# Patient Record
Sex: Male | Born: 1955
Health system: Southern US, Community
[De-identification: ages and names within clinical notes are randomized; demographics above are authoritative.]

## PROBLEM LIST (undated history)

## (undated) DIAGNOSIS — E7800 Pure hypercholesterolemia, unspecified: Secondary | ICD-10-CM

## (undated) DIAGNOSIS — R112 Nausea with vomiting, unspecified: Secondary | ICD-10-CM

## (undated) DIAGNOSIS — E039 Hypothyroidism, unspecified: Secondary | ICD-10-CM

## (undated) DIAGNOSIS — J45909 Unspecified asthma, uncomplicated: Secondary | ICD-10-CM

## (undated) DIAGNOSIS — M1990 Unspecified osteoarthritis, unspecified site: Secondary | ICD-10-CM

## (undated) DIAGNOSIS — G4730 Sleep apnea, unspecified: Secondary | ICD-10-CM

## (undated) DIAGNOSIS — J189 Pneumonia, unspecified organism: Secondary | ICD-10-CM

## (undated) DIAGNOSIS — Z9889 Other specified postprocedural states: Secondary | ICD-10-CM

## (undated) DIAGNOSIS — I10 Essential (primary) hypertension: Secondary | ICD-10-CM

## (undated) HISTORY — PX: OTHER SURGICAL HISTORY: SHX169

## (undated) HISTORY — PX: ORTHOPEDIC SURGERY: SHX850

---

## 2000-12-17 ENCOUNTER — Encounter: Payer: Self-pay | Admitting: Internal Medicine

## 2000-12-17 ENCOUNTER — Ambulatory Visit (HOSPITAL_COMMUNITY): Admission: RE | Admit: 2000-12-17 | Discharge: 2000-12-17 | Payer: Self-pay | Admitting: Internal Medicine

## 2000-12-20 ENCOUNTER — Emergency Department (HOSPITAL_COMMUNITY): Admission: EM | Admit: 2000-12-20 | Discharge: 2000-12-20 | Payer: Self-pay | Admitting: *Deleted

## 2001-01-03 ENCOUNTER — Encounter: Payer: Self-pay | Admitting: Otolaryngology

## 2001-01-03 ENCOUNTER — Encounter: Admission: RE | Admit: 2001-01-03 | Discharge: 2001-01-03 | Payer: Self-pay | Admitting: Otolaryngology

## 2001-02-08 ENCOUNTER — Ambulatory Visit (HOSPITAL_COMMUNITY): Admission: RE | Admit: 2001-02-08 | Discharge: 2001-02-08 | Payer: Self-pay | Admitting: Gastroenterology

## 2002-07-03 ENCOUNTER — Encounter: Payer: Self-pay | Admitting: Internal Medicine

## 2002-07-03 ENCOUNTER — Ambulatory Visit (HOSPITAL_COMMUNITY): Admission: RE | Admit: 2002-07-03 | Discharge: 2002-07-03 | Payer: Self-pay | Admitting: Internal Medicine

## 2002-07-06 ENCOUNTER — Encounter: Payer: Self-pay | Admitting: Internal Medicine

## 2002-07-06 ENCOUNTER — Ambulatory Visit (HOSPITAL_COMMUNITY): Admission: RE | Admit: 2002-07-06 | Discharge: 2002-07-06 | Payer: Self-pay | Admitting: Internal Medicine

## 2002-07-10 ENCOUNTER — Encounter: Admission: RE | Admit: 2002-07-10 | Discharge: 2002-07-10 | Payer: Self-pay | Admitting: Orthopedic Surgery

## 2002-07-10 ENCOUNTER — Encounter: Payer: Self-pay | Admitting: Orthopedic Surgery

## 2002-07-11 ENCOUNTER — Encounter: Admission: RE | Admit: 2002-07-11 | Discharge: 2002-07-11 | Payer: Self-pay | Admitting: Orthopedic Surgery

## 2002-07-11 ENCOUNTER — Encounter: Payer: Self-pay | Admitting: Orthopedic Surgery

## 2002-12-05 ENCOUNTER — Encounter: Payer: Self-pay | Admitting: Family Medicine

## 2002-12-05 ENCOUNTER — Ambulatory Visit (HOSPITAL_COMMUNITY): Admission: RE | Admit: 2002-12-05 | Discharge: 2002-12-05 | Payer: Self-pay | Admitting: Family Medicine

## 2003-01-16 ENCOUNTER — Encounter: Payer: Self-pay | Admitting: Internal Medicine

## 2003-01-16 ENCOUNTER — Inpatient Hospital Stay (HOSPITAL_COMMUNITY): Admission: AD | Admit: 2003-01-16 | Discharge: 2003-01-18 | Payer: Self-pay | Admitting: Internal Medicine

## 2003-01-17 ENCOUNTER — Encounter: Payer: Self-pay | Admitting: Internal Medicine

## 2003-02-03 ENCOUNTER — Ambulatory Visit (HOSPITAL_BASED_OUTPATIENT_CLINIC_OR_DEPARTMENT_OTHER): Admission: RE | Admit: 2003-02-03 | Discharge: 2003-02-03 | Payer: Self-pay | Admitting: Neurology

## 2003-07-04 ENCOUNTER — Ambulatory Visit (HOSPITAL_COMMUNITY)
Admission: RE | Admit: 2003-07-04 | Discharge: 2003-07-04 | Payer: Self-pay | Admitting: Physical Medicine and Rehabilitation

## 2005-02-06 ENCOUNTER — Emergency Department (HOSPITAL_COMMUNITY): Admission: EM | Admit: 2005-02-06 | Discharge: 2005-02-06 | Payer: Self-pay | Admitting: Emergency Medicine

## 2009-01-21 ENCOUNTER — Ambulatory Visit (HOSPITAL_COMMUNITY): Admission: RE | Admit: 2009-01-21 | Discharge: 2009-01-21 | Payer: Self-pay | Admitting: Family Medicine

## 2010-09-25 NOTE — Discharge Summary (Signed)
   [name], [name]                         ACCOUNT NO.:  [id number]   MEDICAL RECORD NO.:  [id number]                   PATIENT TYPE:  INP   LOCATION:  A208                                 FACILITY:  [facility]   PHYSICIAN:  Madelin Rear. Sherwood Gambler, M.D.             DATE OF BIRTH:  05/17/1955   DATE OF ADMISSION:  01/16/2003  DATE OF DISCHARGE:  01/18/2003                                 DISCHARGE SUMMARY   DISCHARGE DIAGNOSES:  1. Syncope  2. Malaise and fatigue.  3. Hypertension.  4. Tension headache.  5. Hyperlipidemia.  6. Nonspecific chest pain.  7. Abdominal pain.   DISCHARGE MEDICATIONS:  1. Toprol XL 50 mg p.o. daily.  2. Xanax p.r.n.  3. Ambien p.r.n.  4. Zocor 20 mg daily.   SUMMARY:  The patient was admitted after he experienced recurrent episodes  over the past several months of recurrent weak spells and near syncope.  It  is not clearly positional or vertigo-like in nature.  He has some vague  chest discomfort located in the left upper chest as well as a squeezing and  sometimes sharp pain variable in duration, intensity and frequency.  He had  no palpitations, hematemesis, hematochezia, melena or GI symptoms except for  some limited left upper quadrant abdominal pain.  He has had intractable  headaches, worked by an Proofreader, and found to have no sinusitis.  Although, he did have several rounds of antibiotic therapy.  The patient was  admitted for atypical chest pain and syncope and rule out.   He responded well to intervention, was seen in consultation by Neurology as  well as Cardiology and discharged in stable condition for office follow up.  He had no recurrence of syncope.  Although, he was begun on Toprol in house  and tolerated this well.     ___________________________________________                                         Madelin Rear. Sherwood Gambler, M.D.   LJF/MEDQ  D:  02/04/2003  T:  02/04/2003  Job:  [id number]

## 2010-09-25 NOTE — H&P (Signed)
[name], [name] NO.:  [id number]   MEDICAL RECORD NO.:  [id number]                   PATIENT TYPE:   LOCATION:                                       FACILITY:   PHYSICIAN:  Madelin Rear. Sherwood Gambler, M.D.             DATE OF BIRTH:  10-Mar-1956   DATE OF ADMISSION:  DATE OF DISCHARGE:                                HISTORY & PHYSICAL   CHIEF COMPLAINT:  Weak spells.   HISTORY OF PRESENT ILLNESS:  The patient has had recurrent episodes over the  past two to three months of what he describes as a drained-out feeling.  It  is not clearly positional related or activity related.  He denies any  association with timing of meals or skipping same.  He did admit to some  vague chest discomfort located in the left upper chest area described as a  squeezing and sometimes sharp pain, variable in duration and intensity.  He  denied any palpitations.  He did admit to several episodes including today  of a diaphoretic spell.  He has had no nausea or vomiting; no hematemesis,  hematochezia, or melena.  He has had no abdominal pain, but please see the  exam below.  He denied any shortness of breath.  He did admit on review of  systems to having persistent intractable headaches worked up by an Proofreader  and found to have no sinusitis although he did complete several thorough  rounds of antibiotic therapy.  Today, he admits to from lying to sitting  having a little bit of positional vertigo.  This rapidly extinguishes  spontaneously.  His headache has been persistent and slightly worse today as  well.  Denies any stiff neck, visual changes, or other cranial nerve or  focal neurologic symptoms or signs.   PAST MEDICAL HISTORY:  1. Hyperlipidemia.  He is currently maintained on Zocor 20 mg p.o. daily.  2. He has allergic rhinitis maintained on antihistamine therapy.   SOCIAL HISTORY:  He does not smoke, rarely uses alcohol, and there is no  substance abuse.   FAMILY  HISTORY:  Positive for coronary disease in a deceased father as well  as deceased mother.  An aortic aneurysm in a paternal grandfather.  He has a  spouse that is in good health and his brothers and sisters are in good  health, one daughter in good health.   REVIEW OF SYSTEMS:  As under HPI; all else negative except he did admit to  some hot flash sensation.   PHYSICAL EXAMINATION:  GENERAL:  He appears ill but not toxic.  HEAD AND NECK:  Shows no JVD or adenopathy; neck is supple.  CHEST:  Clear.  CARDIAC:  Regular rhythm without murmur, gallop, or rub.  There are no  carotid bruits.  ABDOMEN:  Soft.  However, he did guard quite a bit when palpating the left  lower quadrant.  No organomegaly or masses were appreciated but the  tenderness in the left lower quadrant as well as suprapubic area was  reproducible.  He was unaware of the pain until I examined him.  EXTREMITIES:  Showed no clubbing, cyanosis, or edema.  NEUROLOGIC:  Shows no evidence of nystagmus even during an episode of  vertigo which extinguished.  No cranial nerve deficits, and all motor  function was normal.  Gait was not assessed due to his ill feeling.   LABORATORY DATA:  EKG was obtained in the office which revealed no acute  ischemic or infarction and sinus rhythm was noted.   IMPRESSION:  1. Atypical chest pain associated with near syncope.  He needs a rule out     myocardial infarction protocol and be admitted for telemetry monitoring.  2. Hypertension.  Noted in the office to be 152/100 with some leaps of blood     pressure according to him historically on self-observation.  Rule out     pheochromocytoma, especially when associated with near syncope and hot     flash sensation.  P.r.n. coverage with clonidine pending a renal vascular     workup and cardiac workup.  3. Vertigo with persistent headaches.  Check an MRI, make sure there is no     posterior fossa lesion.  Neurology consultation.  Cardiology  consultation     for above.                                               Madelin Rear. Sherwood Gambler, M.D.    LJF/MEDQ  D:  01/16/2003  T:  01/16/2003  Job:  [id number]

## 2010-09-25 NOTE — Consult Note (Signed)
NAMESTEPHENS, [name]                         ACCOUNT NO.:  [id number]   MEDICAL RECORD NO.:  [id number]                   PATIENT TYPE:  INP   LOCATION:  A208                                 FACILITY:  [facility]   PHYSICIAN:  Kofi A. Gerilyn Pilgrim, M.D.              DATE OF BIRTH:  06/25/1955   DATE OF CONSULTATION:  01/16/2003  DATE OF DISCHARGE:                                   CONSULTATION   REPORT TITLE:  NEUROLOGY CONSULTATION   CONSULTING PHYSICIAN:  Kofi A. Gerilyn Pilgrim, M.D.   REFERRING PHYSICIAN:  Madelin Rear. Sherwood Gambler, M.D.   IMPRESSION:  1. Mixed tension/migraine headaches, now that have been transformed to     chronic daily headaches.  2. Unusual spell; I suspect that it is due to episodic increase in blood     pressure.  Other possibilities include thyroid disease, arrhythmias,     headache and even anxiety disorder.  3. Suspect that he has obstructive sleep apnea syndrome.   RECOMMENDATIONS:  1. Agree with current evaluation and workup.  2. I am going to add a polysomnography to the evaluation.  3. I am also going to suggest beta-blockers which could help with headache     prevention and blood pressure control.   HISTORY:  This is a 55 year old Caucasian man who reportedly has not been  feeling well over the past few months.  He reports, for the past 3-4, not  feeling well.  Upon awakening, he reports feeling unrested and weak in  general.  His wife does endorse that he snores nightly.  The patient has had  chronic headaches for many months.  The headaches are located in the  bifrontal region and also in the paranasal region, but they are particularly  located in the frontal region.  They occur almost on a daily basis.  Over  the past 30 days, he has had 20 headache days.  The headaches are not  associated with nausea, emesis or phonophobia; however, they are associated  with photophobia and blurry vision.   The patient was actually admitted to the hospital because of  having some  unusual spells.  The spells are characterized by the acute onset of  generalized weakness as if he is unable to do anything.  He then feels a  warm sensation all over his body, particularly the thoracic and abdominal  region.  These spells last approximately four hours.  These spells always  happen in the morning, approximately 10 a.m. to 11 a.m.  They happen both  after food and also if he does not eat breakfast.   The patient goes to bed at approximately 1:30 to 2 a.m. because he works the  second shift.  He then sleeps until about 9 a.m.  The patient  reports  having headaches with these spells, but the headaches seem not to be the  problem, I think.  His baseline headaches seem to  increase when he has these  spells.  The patient denies any focal weakness, numbness, dysarthria,  dysphagia, or diplopia with these spells.  There is no specific chest pain  or palpitations noted.  No nausea or GI symptoms are reported with these  spells.    PAST MEDICAL HISTORY:  1. Seasonal allergies.  2. Diskogenic low back pain.  3. Hypercholesterolemia.  4. The patient was recently diagnosed with hypertension and has been placed     on blood pressure medication over the past two weeks.  5. History of injury to the right knee.   ADMISSION MEDICATIONS:  Hydrochlorothiazide, Zocor, Nasacort, nasal saline,  Bextra, and Allegra.   PAST SURGICAL HISTORY:  The patient has had right ankle surgery four times.  He has had sinus surgery and also two epidural injections for low back pain.   REVIEW OF SYSTEMS:  Our review of systems were reviewed extensively and  unremarkable, other than as stated in the history of present illness.   FAMILY HISTORY:  Significant for cardiac disease in both parents, and also  mother who had a stroke.   SOCIAL HISTORY:  The patient is married.  No reports of alcohol or tobacco  use.   PHYSICAL EXAMINATION:  GENERAL:  Reveals a well-developed pleasant  gentleman  in no acute distress.  VITAL SIGNS:  Blood pressure 140/101, pulse 74, respirations 20, temperature  98.  NECK:  Supple.  LUNGS:  Clear to auscultation bilaterally.  CARDIOVASCULAR:  Shows a normal S1 and S2.  ABDOMEN:  Soft.  EXTREMITIES: No edema.  NEUROLOGICAL:  The patient is awake, alert and converses coherently and with  no obvious language or speech problems.  Cranial nerve evaluation shows that  the pupils are equal, round and react to light and accommodation.  Extraocular movements are full.  Visual fields are intact.  Funduscopic  examination is done on the left and fundus is healthy with flat disks and  spontaneous venous pulsations.  Facial muscle strength is normal.  Tongue is  midline.  Uvula is midline.  Motor examination shows normal tone, bulk and  strength.  There is no pronator drift.  Coordination is intact.  Reflexes  are +2.  Plantar reflexes are both flexor.  Sensory examination is normal to  temperature and light touch.  Gait is normal.    LABORATORY DATA:  Laboratory values were reviewed and are essentially  unremarkable, although he had potassium 4.1.  CPK 121.  CBC is fine.  Liver  enzymes are normal.  Urinalysis is normal.   Thank you for this consultation.                                               Kofi A. Gerilyn Pilgrim, M.D.    KAD/MEDQ  D:  01/16/2003  T:  01/16/2003  Job:  [id number]   cc:   Madelin Rear. Sherwood Gambler, M.D.  [address]  [place]  [place] [address]  Fax: [phone]

## 2011-02-07 ENCOUNTER — Inpatient Hospital Stay (INDEPENDENT_AMBULATORY_CARE_PROVIDER_SITE_OTHER)
Admission: RE | Admit: 2011-02-07 | Discharge: 2011-02-07 | Disposition: A | Discharge status: Home / Self Care | Payer: 59 | Source: Ambulatory Visit | Attending: Emergency Medicine | Admitting: Emergency Medicine

## 2011-02-07 DIAGNOSIS — H60399 Other infective otitis externa, unspecified ear: Secondary | ICD-10-CM

## 2011-07-04 ENCOUNTER — Emergency Department (HOSPITAL_COMMUNITY)
Admission: EM | Admit: 2011-07-04 | Discharge: 2011-07-04 | Disposition: A | Discharge status: Home / Self Care | Payer: 59 | Source: Home / Self Care | Attending: Emergency Medicine | Admitting: Emergency Medicine

## 2011-07-04 ENCOUNTER — Encounter (HOSPITAL_COMMUNITY): Payer: Self-pay

## 2011-07-04 DIAGNOSIS — J069 Acute upper respiratory infection, unspecified: Secondary | ICD-10-CM

## 2011-07-04 DIAGNOSIS — J329 Chronic sinusitis, unspecified: Secondary | ICD-10-CM

## 2011-07-04 HISTORY — DX: Hypothyroidism, unspecified: E03.9

## 2011-07-04 HISTORY — DX: Pure hypercholesterolemia, unspecified: E78.00

## 2011-07-04 MED ORDER — AMOXICILLIN-POT CLAVULANATE 875-125 MG PO TABS: 1.0000 | ORAL_TABLET | Freq: Two times a day (BID) | ORAL | Status: AC

## 2011-07-04 MED ORDER — BENZONATATE 200 MG PO CAPS: 200.0000 mg | ORAL_CAPSULE | Freq: Three times a day (TID) | ORAL | Status: AC | PRN

## 2011-07-04 NOTE — ED Provider Notes (Signed)
Chief Complaint  Patient presents with  . Cough    History of Present Illness:   [name] has had a one-week history of nasal congestion with yellow-green drainage, sinus pressure, and ear congestion. He also notes sore throat, postnasal drip, hoarseness, and cough productive yellow-green sputum and he has not had fever or chills.  Review of Systems:  Other than noted above, the patient denies any of the following symptoms. Systemic:  No fever, chills, sweats, fatigue, myalgias, headache, or anorexia. Eye:  No redness, pain or drainage. ENT:  No earache, nasal congestion, rhinorrhea, sinus pressure, or sore throat. Lungs:  No cough, sputum production, wheezing, shortness of breath. Or chest pain. GI:  No nausea, vomiting, abdominal pain or diarrhea. Skin:  No rash or itching.  PMFSH:  Past medical history, family history, social history, meds, and allergies were reviewed.  Physical Exam:   Vital signs:  BP 145/81  Pulse 91  Temp(Src) 99.9 F (37.7 C) (Oral)  Resp 20  SpO2 98% General:  Alert, in no distress. Eye:  No conjunctival injection or drainage. ENT:  TMs and canals were normal, without erythema or inflammation.  Nasal mucosa was clear and uncongested, without drainage.  Mucous membranes were moist.  Pharynx was clear, without exudate or drainage.  There were no oral ulcerations or lesions. Neck:  Supple, no adenopathy, tenderness or mass. Lungs:  No respiratory distress.  Lungs were clear to auscultation, without wheezes, rales or rhonchi.  Breath sounds were clear and equal bilaterally. Heart:  Regular rhythm, without gallops, murmers or rubs. Skin:  Clear, warm, and dry, without rash or lesions.  Labs:  No results found for this or any previous visit.   Radiology:  No results found.  Assessment:   Diagnoses that have been ruled out:  None  Diagnoses that are still under consideration:  None  Final diagnoses:  Viral upper respiratory infection  Sinusitis       Plan:   1.  The following meds were prescribed:   New Prescriptions   AMOXICILLIN-CLAVULANATE (AUGMENTIN) 875-125 MG PER TABLET    Take 1 tablet by mouth 2 (two) times daily.   BENZONATATE (TESSALON) 200 MG CAPSULE    Take 1 capsule (200 mg total) by mouth 3 (three) times daily as needed for cough.   2.  The patient was instructed in symptomatic care and handouts were given. 3.  The patient was told to return if becoming worse in any way, if no better in 3 or 4 days, and given some red flag symptoms that would indicate earlier return.   Roque Lias, MD 07/04/11 1055

## 2011-07-04 NOTE — ED Notes (Signed)
Pt has sorethroat, cough and head and chest congestion for one week.

## 2011-07-04 NOTE — Discharge Instructions (Signed)

## 2012-03-28 ENCOUNTER — Other Ambulatory Visit (HOSPITAL_COMMUNITY): Payer: Self-pay | Admitting: Internal Medicine

## 2012-03-28 DIAGNOSIS — R1011 Right upper quadrant pain: Secondary | ICD-10-CM

## 2012-03-30 ENCOUNTER — Ambulatory Visit (HOSPITAL_COMMUNITY)
Admission: RE | Admit: 2012-03-30 | Discharge: 2012-03-30 | Disposition: A | Discharge status: Home / Self Care | Payer: 59 | Source: Ambulatory Visit | Attending: Internal Medicine | Admitting: Internal Medicine

## 2012-03-30 DIAGNOSIS — R1011 Right upper quadrant pain: Secondary | ICD-10-CM

## 2013-01-18 ENCOUNTER — Other Ambulatory Visit: Payer: Self-pay | Admitting: Cardiovascular Disease

## 2013-01-18 LAB — HEMOGLOBIN A1C
Hgb A1c MFr Bld: 6.1 % — ABNORMAL HIGH (ref <5.7)
Mean Plasma Glucose: 128 mg/dL — ABNORMAL HIGH (ref <117)

## 2013-01-18 LAB — CBC WITH DIFFERENTIAL/PLATELET
Eosinophils Relative: 5 % (ref 0–5)
HCT: 41.7 % (ref 39.0–52.0)
Hemoglobin: 14.6 g/dL (ref 13.0–17.0)
Lymphocytes Relative: 37 % (ref 12–46)
Lymphs Abs: 2.3 10*3/uL (ref 0.7–4.0)
MCV: 89.7 fL (ref 78.0–100.0)
Monocytes Absolute: 0.4 10*3/uL (ref 0.1–1.0)
Monocytes Relative: 7 % (ref 3–12)
Platelets: 250 10*3/uL (ref 150–400)
RBC: 4.65 MIL/uL (ref 4.22–5.81)
WBC: 6.3 10*3/uL (ref 4.0–10.5)

## 2013-01-18 LAB — LIPID PANEL
Cholesterol: 193 mg/dL (ref 0–200)
Total CHOL/HDL Ratio: 3.2 Ratio

## 2013-01-18 LAB — COMPREHENSIVE METABOLIC PANEL
ALT: 24 U/L (ref 0–53)
CO2: 27 mEq/L (ref 19–32)
Calcium: 9.2 mg/dL (ref 8.4–10.5)
Chloride: 104 mEq/L (ref 96–112)
Creat: 1.14 mg/dL (ref 0.50–1.35)
Glucose, Bld: 94 mg/dL (ref 70–99)

## 2013-02-20 ENCOUNTER — Emergency Department (HOSPITAL_COMMUNITY): Payer: 59

## 2013-02-20 ENCOUNTER — Encounter (HOSPITAL_COMMUNITY): Payer: Self-pay | Admitting: Emergency Medicine

## 2013-02-20 ENCOUNTER — Emergency Department (HOSPITAL_COMMUNITY)
Admission: EM | Admit: 2013-02-20 | Discharge: 2013-02-20 | Disposition: A | Discharge status: Home / Self Care | Payer: 59 | Attending: Emergency Medicine | Admitting: Emergency Medicine

## 2013-02-20 DIAGNOSIS — S139XXA Sprain of joints and ligaments of unspecified parts of neck, initial encounter: Secondary | ICD-10-CM | POA: Insufficient documentation

## 2013-02-20 DIAGNOSIS — S339XXA Sprain of unspecified parts of lumbar spine and pelvis, initial encounter: Secondary | ICD-10-CM | POA: Insufficient documentation

## 2013-02-20 DIAGNOSIS — Z79899 Other long term (current) drug therapy: Secondary | ICD-10-CM | POA: Insufficient documentation

## 2013-02-20 DIAGNOSIS — Y92410 Unspecified street and highway as the place of occurrence of the external cause: Secondary | ICD-10-CM | POA: Insufficient documentation

## 2013-02-20 DIAGNOSIS — S39012A Strain of muscle, fascia and tendon of lower back, initial encounter: Secondary | ICD-10-CM

## 2013-02-20 DIAGNOSIS — S161XXA Strain of muscle, fascia and tendon at neck level, initial encounter: Secondary | ICD-10-CM

## 2013-02-20 DIAGNOSIS — Y9389 Activity, other specified: Secondary | ICD-10-CM | POA: Insufficient documentation

## 2013-02-20 DIAGNOSIS — E7800 Pure hypercholesterolemia, unspecified: Secondary | ICD-10-CM | POA: Insufficient documentation

## 2013-02-20 DIAGNOSIS — E039 Hypothyroidism, unspecified: Secondary | ICD-10-CM | POA: Insufficient documentation

## 2013-02-20 MED ORDER — HYDROCODONE-ACETAMINOPHEN 5-325 MG PO TABS
2.0000 | ORAL_TABLET | Freq: Once | ORAL | Status: AC
  Administered 2013-02-20: 2 via ORAL
  Filled 2013-02-20: qty 2
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
## 2014-07-12 ENCOUNTER — Other Ambulatory Visit (HOSPITAL_COMMUNITY): Payer: Self-pay | Admitting: Internal Medicine

## 2014-07-12 DIAGNOSIS — R1011 Right upper quadrant pain: Secondary | ICD-10-CM

## 2014-07-17 ENCOUNTER — Ambulatory Visit (HOSPITAL_COMMUNITY)
Admission: RE | Admit: 2014-07-17 | Discharge: 2014-07-17 | Disposition: A | Discharge status: Home / Self Care | Payer: 59 | Source: Ambulatory Visit | Attending: Internal Medicine | Admitting: Internal Medicine

## 2014-07-17 DIAGNOSIS — R1011 Right upper quadrant pain: Secondary | ICD-10-CM | POA: Insufficient documentation

## 2014-08-21 ENCOUNTER — Encounter (INDEPENDENT_AMBULATORY_CARE_PROVIDER_SITE_OTHER): Payer: Self-pay | Admitting: *Deleted

## 2014-09-10 ENCOUNTER — Ambulatory Visit (INDEPENDENT_AMBULATORY_CARE_PROVIDER_SITE_OTHER): Payer: 59 | Admitting: Internal Medicine

## 2015-01-14 ENCOUNTER — Encounter: Payer: Self-pay | Admitting: Cardiology

## 2015-01-14 ENCOUNTER — Encounter: Payer: Self-pay | Admitting: Cardiovascular Disease

## 2015-07-02 ENCOUNTER — Other Ambulatory Visit (HOSPITAL_COMMUNITY): Payer: Self-pay | Admitting: Internal Medicine

## 2015-07-02 DIAGNOSIS — R198 Other specified symptoms and signs involving the digestive system and abdomen: Secondary | ICD-10-CM

## 2015-07-08 ENCOUNTER — Encounter (HOSPITAL_COMMUNITY): Payer: Self-pay

## 2015-07-08 ENCOUNTER — Encounter (HOSPITAL_COMMUNITY)
Admission: RE | Admit: 2015-07-08 | Discharge: 2015-07-08 | Disposition: A | Discharge status: Home / Self Care | Payer: Commercial Managed Care - HMO | Source: Ambulatory Visit | Attending: Internal Medicine | Admitting: Internal Medicine

## 2015-07-08 DIAGNOSIS — R198 Other specified symptoms and signs involving the digestive system and abdomen: Secondary | ICD-10-CM | POA: Insufficient documentation

## 2015-07-08 MED ORDER — TECHNETIUM TC 99M MEBROFENIN IV KIT
5.0000 | PACK | Freq: Once | INTRAVENOUS | Status: AC | PRN
  Administered 2015-07-08: 5 via INTRAVENOUS

## 2015-07-08 MED ORDER — STERILE WATER FOR INJECTION IJ SOLN
INTRAMUSCULAR | Status: AC
  Administered 2015-07-08: 1.6 mL
  Filled 2015-07-08: qty 10

## 2015-07-08 MED ORDER — SINCALIDE 5 MCG IJ SOLR
INTRAMUSCULAR | Status: AC
  Administered 2015-07-08: 1.6 ug
  Filled 2015-07-08: qty 5

## 2015-07-09 ENCOUNTER — Encounter: Payer: Self-pay | Admitting: *Deleted

## 2015-07-10 ENCOUNTER — Ambulatory Visit (INDEPENDENT_AMBULATORY_CARE_PROVIDER_SITE_OTHER): Payer: Commercial Managed Care - HMO | Admitting: Cardiovascular Disease

## 2015-07-10 ENCOUNTER — Encounter: Payer: Self-pay | Admitting: *Deleted

## 2015-07-10 ENCOUNTER — Encounter: Payer: Self-pay | Admitting: Cardiovascular Disease

## 2015-07-10 VITALS — BP 107/72 | HR 90 | Ht 64.0 in | Wt 186.0 lb

## 2015-07-10 DIAGNOSIS — Z136 Encounter for screening for cardiovascular disorders: Secondary | ICD-10-CM | POA: Diagnosis not present

## 2015-07-10 DIAGNOSIS — Z8249 Family history of ischemic heart disease and other diseases of the circulatory system: Secondary | ICD-10-CM

## 2015-07-10 DIAGNOSIS — R0609 Other forms of dyspnea: Secondary | ICD-10-CM | POA: Diagnosis not present

## 2015-07-10 DIAGNOSIS — E785 Hyperlipidemia, unspecified: Secondary | ICD-10-CM

## 2015-07-10 DIAGNOSIS — I10 Essential (primary) hypertension: Secondary | ICD-10-CM | POA: Diagnosis not present

## 2015-07-10 NOTE — Progress Notes (Signed)
Patient ID: [name], male   DOB: 07/18/1955, 60 y.o.   MRN: [id number]       CARDIOLOGY CONSULT NOTE  Patient ID: [name] MRN: [id number] DOB/AGE: [birth year] 60 y.o.  Admit date: (Not on file) Primary Physician Dwana Melena, MD  Reason for Consultation: HTN, chest pain  HPI: The patient is a 60 year old male with a history of hypertension, hyperlipidemia, and hypothyroidism.  He underwent a normal nuclear stress test in 2012.  ECG performed in the office today demonstrates normal sinus rhythm with no ischemic ST segment or T-wave abnormalities, nor any arrhythmias.  He has a family history of heart disease and wanted to be evaluated. His father died at the age of 64 of coronary artery disease and his mother died at the age of 64 of coronary artery disease. His father had his first MI at 61. His mother had valve replacement at 15 and also had a stroke. He said they were chain smokers. His oldest sister had an MI at age 61 and eventually succumbed to coronary disease. She was a heavy smoker as well.  The patient denies any symptoms of chest pain, palpitations, shortness of breath, lightheadedness, dizziness, leg swelling, orthopnea, PND, and syncope.  He does not smoke and avoids red meat. He works 12 hours a day and denies any premature fatigue over the past one year.  A review of labs performed on 06/26/15 showed BUN 14, creatinine 1.19, hemoglobin 13.5, platelets 246, total cholesterol 200, triglycerides 82, HDL 46, LDL 138. HbA1c 6%, TSH 0.5.  Fam Hx: He has a family history of heart disease and wanted to be evaluated. His father died at the age of 59 of coronary artery disease and his mother died at the age of 74 of coronary artery disease. His father had his first MI at 93. His mother had valve replacement at 19 and also had a stroke. He said they were chain smokers. His oldest sister had an MI at age 76 and eventually succumbed to coronary disease. She was a heavy smoker  as well.    Allergies  Allergen Reactions  . Ceclor [Cefaclor] Itching    Current Outpatient Prescriptions  Medication Sig Dispense Refill  . Coenzyme Q10 (CO Q 10 PO) Take 1 capsule by mouth daily.     . Cyanocobalamin (B-12 PO) Take 1 tablet by mouth daily.    . fexofenadine (ALLEGRA) 180 MG tablet Take 180 mg by mouth daily.    Marland Kitchen levothyroxine (SYNTHROID, LEVOTHROID) 88 MCG tablet Take 1 tablet by mouth daily.    Marland Kitchen lisinopril (PRINIVIL,ZESTRIL) 5 MG tablet Take 1 tablet by mouth daily.    . Magnesium Hydroxide (MAGNESIA PO) Take 1 tablet by mouth daily.    . Multiple Vitamin (MULTIVITAMIN WITH MINERALS) TABS tablet Take 1 tablet by mouth daily.    . Omega-3 Fatty Acids (FISH OIL) 1000 MG CAPS Take 1 capsule by mouth daily.    . pravastatin (PRAVACHOL) 80 MG tablet Take 80 mg by mouth at bedtime.     Marland Kitchen PROAIR HFA 108 (90 Base) MCG/ACT inhaler Inhale 2 puffs into the lungs every 4 (four) hours as needed.    . vitamin C (ASCORBIC ACID) 500 MG tablet Take 500 mg by mouth daily.    Marland Kitchen zinc gluconate 50 MG tablet Take 50 mg by mouth daily.     No current facility-administered medications for this visit.    Past Medical History  Diagnosis Date  . Hypothyroidism   .  Hypercholesteremia     Past Surgical History  Procedure Laterality Date  . Orthopedic surgery      ankle  . Other surgical history      sinus    Social History   Social History  . Marital Status: Married    Spouse Name: N/A  . Number of Children: N/A  . Years of Education: N/A   Occupational History  . Not on file.   Social History Main Topics  . Smoking status: Never Smoker   . Smokeless tobacco: Never Used  . Alcohol Use: 0.0 oz/week    0 Standard drinks or equivalent per week  . Drug Use: No  . Sexual Activity: Not on file   Other Topics Concern  . Not on file   Social History Narrative       Prior to Admission medications   Medication Sig Start Date End Date Taking? Authorizing Provider    Coenzyme Q10 (CO Q 10 PO) Take 1 capsule by mouth daily.    Yes Historical Provider, MD  Cyanocobalamin (B-12 PO) Take 1 tablet by mouth daily.   Yes Historical Provider, MD  fexofenadine (ALLEGRA) 180 MG tablet Take 180 mg by mouth daily.   Yes Historical Provider, MD  levothyroxine (SYNTHROID, LEVOTHROID) 88 MCG tablet Take 1 tablet by mouth daily. 06/28/15  Yes Historical Provider, MD  lisinopril (PRINIVIL,ZESTRIL) 5 MG tablet Take 1 tablet by mouth daily. 06/28/15  Yes Historical Provider, MD  Magnesium Hydroxide (MAGNESIA PO) Take 1 tablet by mouth daily.   Yes Historical Provider, MD  Multiple Vitamin (MULTIVITAMIN WITH MINERALS) TABS tablet Take 1 tablet by mouth daily.   Yes Historical Provider, MD  Omega-3 Fatty Acids (FISH OIL) 1000 MG CAPS Take 1 capsule by mouth daily.   Yes Historical Provider, MD  pravastatin (PRAVACHOL) 80 MG tablet Take 80 mg by mouth at bedtime.  01/25/13  Yes Historical Provider, MD  PROAIR HFA 108 (90 Base) MCG/ACT inhaler Inhale 2 puffs into the lungs every 4 (four) hours as needed. 07/01/15  Yes Historical Provider, MD  vitamin C (ASCORBIC ACID) 500 MG tablet Take 500 mg by mouth daily.   Yes Historical Provider, MD  zinc gluconate 50 MG tablet Take 50 mg by mouth daily.   Yes Historical Provider, MD     Review of systems complete and found to be negative unless listed above in HPI     Physical exam Blood pressure 107/72, pulse 90, height  (1.626 m), weight 186 lb (84.369 kg). General: NAD Neck: No JVD, no thyromegaly or thyroid nodule.  Lungs: Clear to auscultation bilaterally with normal respiratory effort. CV: Nondisplaced PMI. Regular rate and rhythm, normal S1/S2, no S3/S4, no murmur.  No peripheral edema.  No carotid bruit.    Abdomen: Soft, nontender, obese, no distention.  Skin: Intact without lesions or rashes.  Neurologic: Alert and oriented x 3.  Psych: Normal affect. Extremities: No clubbing or cyanosis.  HEENT: Normal.   ECG: Most  recent ECG reviewed.  Labs:   Lab Results  Component Value Date   WBC 6.3 01/18/2013   HGB 14.6 01/18/2013   HCT 41.7 01/18/2013   MCV 89.7 01/18/2013   PLT 250 01/18/2013   No results for input(s): NA, K, CL, CO2, BUN, CREATININE, CALCIUM, PROT, BILITOT, ALKPHOS, ALT, AST, GLUCOSE in the last 168 hours.  Invalid input(s): LABALBU No results found for: CKTOTAL, CKMB, CKMBINDEX, TROPONINI  Lab Results  Component Value Date   CHOL 193 01/18/2013  Lab Results  Component Value Date   HDL 61 01/18/2013   Lab Results  Component Value Date   LDLCALC 114* 01/18/2013   Lab Results  Component Value Date   TRIG 90 01/18/2013   Lab Results  Component Value Date   CHOLHDL 3.2 01/18/2013   No results found for: LDLDIRECT       Studies: Nm Hepato W/eject Fract  07/08/2015  CLINICAL DATA:  Pain. EXAM: NUCLEAR MEDICINE HEPATOBILIARY IMAGING WITH GALLBLADDER EF TECHNIQUE: Sequential images of the abdomen were obtained out to 60 minutes following intravenous administration of radiopharmaceutical. After slow intravenous infusion of 1.64 micrograms Cholecystokinin, gallbladder ejection fraction was determined. RADIOPHARMACEUTICALS:  5.0 mCi Tc-78m Choletec IV COMPARISON:  Ultrasound 07/17/2014. FINDINGS: Liver, biliary system, gallbladder, bowel normal. Gallbladder ejection fraction at 60 minutes is a hilar percent. At 60 min, normal ejection fraction is greater than 40%. IMPRESSION: Normal exam. Electronically Signed   By: Maisie Fus  Register   On: 07/08/2015 12:55    ASSESSMENT AND PLAN:  1. Minimal exertional dyspnea: Likely related to seasonal allergies. Given normal ECG and well controlled BP and reasonably controlled lipids, I do not feel noninvasive cardiac testing is warranted at this time. However, I told him should he begin to experience any concerning symptoms, to call me and I will reevaluate him.  2. Essential HTN: Controlled. No changes.  3. Hyperlipidemia: Reviewed above.  Continue statin therapy.  Dispo: f/u prn.   Signed: Prentice Docker, M.D., F.A.C.C.  07/10/2015, 8:33 AM

## 2015-07-10 NOTE — Patient Instructions (Signed)
Continue all current medications. Follow up as needed  

## 2016-06-25 DIAGNOSIS — E039 Hypothyroidism, unspecified: Secondary | ICD-10-CM | POA: Diagnosis not present

## 2016-06-25 DIAGNOSIS — E782 Mixed hyperlipidemia: Secondary | ICD-10-CM | POA: Diagnosis not present

## 2016-06-25 DIAGNOSIS — I10 Essential (primary) hypertension: Secondary | ICD-10-CM | POA: Diagnosis not present

## 2016-06-25 DIAGNOSIS — R7301 Impaired fasting glucose: Secondary | ICD-10-CM | POA: Diagnosis not present

## 2016-06-29 DIAGNOSIS — Z0000 Encounter for general adult medical examination without abnormal findings: Secondary | ICD-10-CM | POA: Diagnosis not present

## 2016-06-29 DIAGNOSIS — I10 Essential (primary) hypertension: Secondary | ICD-10-CM | POA: Diagnosis not present

## 2016-06-29 DIAGNOSIS — E782 Mixed hyperlipidemia: Secondary | ICD-10-CM | POA: Diagnosis not present

## 2016-07-17 DIAGNOSIS — I10 Essential (primary) hypertension: Secondary | ICD-10-CM | POA: Diagnosis not present

## 2016-12-24 DIAGNOSIS — I10 Essential (primary) hypertension: Secondary | ICD-10-CM | POA: Diagnosis not present

## 2016-12-24 DIAGNOSIS — E039 Hypothyroidism, unspecified: Secondary | ICD-10-CM | POA: Diagnosis not present

## 2016-12-24 DIAGNOSIS — R7301 Impaired fasting glucose: Secondary | ICD-10-CM | POA: Diagnosis not present

## 2016-12-28 DIAGNOSIS — E782 Mixed hyperlipidemia: Secondary | ICD-10-CM | POA: Diagnosis not present

## 2016-12-28 DIAGNOSIS — I10 Essential (primary) hypertension: Secondary | ICD-10-CM | POA: Diagnosis not present

## 2016-12-28 DIAGNOSIS — R7301 Impaired fasting glucose: Secondary | ICD-10-CM | POA: Diagnosis not present

## 2017-01-11 DIAGNOSIS — M25571 Pain in right ankle and joints of right foot: Secondary | ICD-10-CM | POA: Diagnosis not present

## 2017-02-25 DIAGNOSIS — M25571 Pain in right ankle and joints of right foot: Secondary | ICD-10-CM | POA: Diagnosis not present

## 2017-03-04 DIAGNOSIS — M25571 Pain in right ankle and joints of right foot: Secondary | ICD-10-CM | POA: Diagnosis not present

## 2017-03-11 DIAGNOSIS — M1711 Unilateral primary osteoarthritis, right knee: Secondary | ICD-10-CM | POA: Diagnosis not present

## 2017-03-28 IMAGING — NM NM HEPATO W/GB/PHARM/[PERSON_NAME]
2 series · 12 of 12 positions shown · non-contrast
Comparison: Ultrasound 07/17/2014.

CLINICAL DATA: Pain.

EXAM:
NUCLEAR MEDICINE HEPATOBILIARY IMAGING WITH GALLBLADDER EF
TECHNIQUE: Sequential images of the abdomen were obtained [DATE] minutes
following intravenous administration of radiopharmaceutical. After
slow intravenous infusion of 1.64 micrograms Cholecystokinin,
gallbladder ejection fraction was determined.
RADIOPHARMACEUTICALS:  5.0 mCi Qc-YYm Choletec IV

[Series 1: biliary · 3.25mm/px · 6 of 60 frames shown]
[frame 6/60]
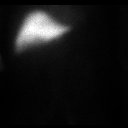
[frame 16/60]
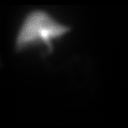
[frame 26/60]
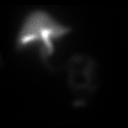
[frame 36/60]
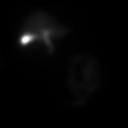
[frame 46/60]
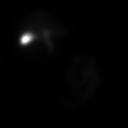
[frame 56/60]
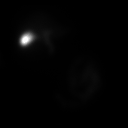

[Series 2: gbef · 3.25mm/px · 6 of 60 frames shown]
[frame 6/60]
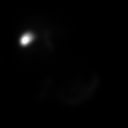
[frame 16/60]
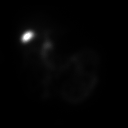
[frame 26/60]
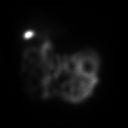
[frame 36/60]
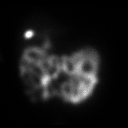
[frame 46/60]
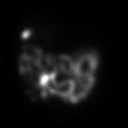
[frame 56/60]
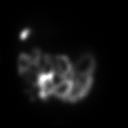

[12 of 12 positions shown; findings below may reference images not displayed]

FINDINGS: Liver, biliary system, gallbladder, bowel normal. Gallbladder
ejection fraction at 60 minutes is a hilar percent.. At 60 min,
normal ejection fraction is greater than 40%.
IMPRESSION: Normal exam.

## 2017-05-23 DIAGNOSIS — J019 Acute sinusitis, unspecified: Secondary | ICD-10-CM | POA: Diagnosis not present

## 2017-05-30 DIAGNOSIS — M1711 Unilateral primary osteoarthritis, right knee: Secondary | ICD-10-CM | POA: Diagnosis not present

## 2017-06-27 DIAGNOSIS — M1711 Unilateral primary osteoarthritis, right knee: Secondary | ICD-10-CM | POA: Diagnosis not present

## 2017-07-01 DIAGNOSIS — I10 Essential (primary) hypertension: Secondary | ICD-10-CM | POA: Diagnosis not present

## 2017-07-01 DIAGNOSIS — E782 Mixed hyperlipidemia: Secondary | ICD-10-CM | POA: Diagnosis not present

## 2017-07-01 DIAGNOSIS — E039 Hypothyroidism, unspecified: Secondary | ICD-10-CM | POA: Diagnosis not present

## 2017-07-06 DIAGNOSIS — Z0000 Encounter for general adult medical examination without abnormal findings: Secondary | ICD-10-CM | POA: Diagnosis not present

## 2017-07-17 DIAGNOSIS — M79644 Pain in right finger(s): Secondary | ICD-10-CM | POA: Diagnosis not present

## 2017-07-17 DIAGNOSIS — L03011 Cellulitis of right finger: Secondary | ICD-10-CM | POA: Diagnosis not present

## 2017-07-22 DIAGNOSIS — M25572 Pain in left ankle and joints of left foot: Secondary | ICD-10-CM | POA: Diagnosis not present

## 2017-07-29 DIAGNOSIS — M7662 Achilles tendinitis, left leg: Secondary | ICD-10-CM | POA: Diagnosis not present

## 2017-08-03 DIAGNOSIS — M7662 Achilles tendinitis, left leg: Secondary | ICD-10-CM | POA: Diagnosis not present

## 2017-08-03 DIAGNOSIS — M9901 Segmental and somatic dysfunction of cervical region: Secondary | ICD-10-CM | POA: Diagnosis not present

## 2017-08-03 DIAGNOSIS — M47812 Spondylosis without myelopathy or radiculopathy, cervical region: Secondary | ICD-10-CM | POA: Diagnosis not present

## 2017-08-03 DIAGNOSIS — M79672 Pain in left foot: Secondary | ICD-10-CM | POA: Diagnosis not present

## 2017-08-03 DIAGNOSIS — M5032 Other cervical disc degeneration, mid-cervical region, unspecified level: Secondary | ICD-10-CM | POA: Diagnosis not present

## 2017-08-08 DIAGNOSIS — M47812 Spondylosis without myelopathy or radiculopathy, cervical region: Secondary | ICD-10-CM | POA: Diagnosis not present

## 2017-08-08 DIAGNOSIS — M79672 Pain in left foot: Secondary | ICD-10-CM | POA: Diagnosis not present

## 2017-08-08 DIAGNOSIS — M9901 Segmental and somatic dysfunction of cervical region: Secondary | ICD-10-CM | POA: Diagnosis not present

## 2017-08-08 DIAGNOSIS — M7662 Achilles tendinitis, left leg: Secondary | ICD-10-CM | POA: Diagnosis not present

## 2017-08-08 DIAGNOSIS — M5032 Other cervical disc degeneration, mid-cervical region, unspecified level: Secondary | ICD-10-CM | POA: Diagnosis not present

## 2017-08-09 DIAGNOSIS — M5030 Other cervical disc degeneration, unspecified cervical region: Secondary | ICD-10-CM | POA: Diagnosis not present

## 2017-08-10 DIAGNOSIS — M9901 Segmental and somatic dysfunction of cervical region: Secondary | ICD-10-CM | POA: Diagnosis not present

## 2017-08-10 DIAGNOSIS — M7662 Achilles tendinitis, left leg: Secondary | ICD-10-CM | POA: Diagnosis not present

## 2017-08-10 DIAGNOSIS — M79672 Pain in left foot: Secondary | ICD-10-CM | POA: Diagnosis not present

## 2017-08-10 DIAGNOSIS — M47812 Spondylosis without myelopathy or radiculopathy, cervical region: Secondary | ICD-10-CM | POA: Diagnosis not present

## 2017-08-10 DIAGNOSIS — M5032 Other cervical disc degeneration, mid-cervical region, unspecified level: Secondary | ICD-10-CM | POA: Diagnosis not present

## 2017-08-12 DIAGNOSIS — M79672 Pain in left foot: Secondary | ICD-10-CM | POA: Diagnosis not present

## 2017-08-12 DIAGNOSIS — M7662 Achilles tendinitis, left leg: Secondary | ICD-10-CM | POA: Diagnosis not present

## 2017-08-16 DIAGNOSIS — M47812 Spondylosis without myelopathy or radiculopathy, cervical region: Secondary | ICD-10-CM | POA: Diagnosis not present

## 2017-08-16 DIAGNOSIS — M9901 Segmental and somatic dysfunction of cervical region: Secondary | ICD-10-CM | POA: Diagnosis not present

## 2017-08-16 DIAGNOSIS — M5032 Other cervical disc degeneration, mid-cervical region, unspecified level: Secondary | ICD-10-CM | POA: Diagnosis not present

## 2017-08-17 DIAGNOSIS — M7662 Achilles tendinitis, left leg: Secondary | ICD-10-CM | POA: Diagnosis not present

## 2017-08-17 DIAGNOSIS — M79672 Pain in left foot: Secondary | ICD-10-CM | POA: Diagnosis not present

## 2017-08-18 DIAGNOSIS — M47812 Spondylosis without myelopathy or radiculopathy, cervical region: Secondary | ICD-10-CM | POA: Diagnosis not present

## 2017-08-18 DIAGNOSIS — M5032 Other cervical disc degeneration, mid-cervical region, unspecified level: Secondary | ICD-10-CM | POA: Diagnosis not present

## 2017-08-18 DIAGNOSIS — M9901 Segmental and somatic dysfunction of cervical region: Secondary | ICD-10-CM | POA: Diagnosis not present

## 2017-08-23 DIAGNOSIS — M9901 Segmental and somatic dysfunction of cervical region: Secondary | ICD-10-CM | POA: Diagnosis not present

## 2017-08-23 DIAGNOSIS — M5032 Other cervical disc degeneration, mid-cervical region, unspecified level: Secondary | ICD-10-CM | POA: Diagnosis not present

## 2017-08-23 DIAGNOSIS — M47812 Spondylosis without myelopathy or radiculopathy, cervical region: Secondary | ICD-10-CM | POA: Diagnosis not present

## 2017-08-24 DIAGNOSIS — M7662 Achilles tendinitis, left leg: Secondary | ICD-10-CM | POA: Diagnosis not present

## 2017-08-24 DIAGNOSIS — M79672 Pain in left foot: Secondary | ICD-10-CM | POA: Diagnosis not present

## 2017-08-29 DIAGNOSIS — M7662 Achilles tendinitis, left leg: Secondary | ICD-10-CM | POA: Diagnosis not present

## 2017-08-31 DIAGNOSIS — M7662 Achilles tendinitis, left leg: Secondary | ICD-10-CM | POA: Diagnosis not present

## 2017-08-31 DIAGNOSIS — M79672 Pain in left foot: Secondary | ICD-10-CM | POA: Diagnosis not present

## 2017-09-07 DIAGNOSIS — M7662 Achilles tendinitis, left leg: Secondary | ICD-10-CM | POA: Diagnosis not present

## 2017-09-07 DIAGNOSIS — M79672 Pain in left foot: Secondary | ICD-10-CM | POA: Diagnosis not present

## 2017-09-08 DIAGNOSIS — M9901 Segmental and somatic dysfunction of cervical region: Secondary | ICD-10-CM | POA: Diagnosis not present

## 2017-09-08 DIAGNOSIS — M47812 Spondylosis without myelopathy or radiculopathy, cervical region: Secondary | ICD-10-CM | POA: Diagnosis not present

## 2017-09-08 DIAGNOSIS — M5032 Other cervical disc degeneration, mid-cervical region, unspecified level: Secondary | ICD-10-CM | POA: Diagnosis not present

## 2017-09-14 DIAGNOSIS — M79672 Pain in left foot: Secondary | ICD-10-CM | POA: Diagnosis not present

## 2017-09-14 DIAGNOSIS — M7662 Achilles tendinitis, left leg: Secondary | ICD-10-CM | POA: Diagnosis not present

## 2017-09-21 DIAGNOSIS — M7662 Achilles tendinitis, left leg: Secondary | ICD-10-CM | POA: Diagnosis not present

## 2017-09-21 DIAGNOSIS — M79672 Pain in left foot: Secondary | ICD-10-CM | POA: Diagnosis not present

## 2017-09-22 DIAGNOSIS — M5032 Other cervical disc degeneration, mid-cervical region, unspecified level: Secondary | ICD-10-CM | POA: Diagnosis not present

## 2017-09-22 DIAGNOSIS — M47812 Spondylosis without myelopathy or radiculopathy, cervical region: Secondary | ICD-10-CM | POA: Diagnosis not present

## 2017-09-22 DIAGNOSIS — M9901 Segmental and somatic dysfunction of cervical region: Secondary | ICD-10-CM | POA: Diagnosis not present

## 2017-09-26 DIAGNOSIS — H43812 Vitreous degeneration, left eye: Secondary | ICD-10-CM | POA: Diagnosis not present

## 2017-10-04 DIAGNOSIS — M5032 Other cervical disc degeneration, mid-cervical region, unspecified level: Secondary | ICD-10-CM | POA: Diagnosis not present

## 2017-10-04 DIAGNOSIS — M9901 Segmental and somatic dysfunction of cervical region: Secondary | ICD-10-CM | POA: Diagnosis not present

## 2017-10-04 DIAGNOSIS — M47812 Spondylosis without myelopathy or radiculopathy, cervical region: Secondary | ICD-10-CM | POA: Diagnosis not present

## 2017-10-18 DIAGNOSIS — M9901 Segmental and somatic dysfunction of cervical region: Secondary | ICD-10-CM | POA: Diagnosis not present

## 2017-10-18 DIAGNOSIS — M5032 Other cervical disc degeneration, mid-cervical region, unspecified level: Secondary | ICD-10-CM | POA: Diagnosis not present

## 2017-10-18 DIAGNOSIS — M47812 Spondylosis without myelopathy or radiculopathy, cervical region: Secondary | ICD-10-CM | POA: Diagnosis not present

## 2017-11-15 DIAGNOSIS — M9901 Segmental and somatic dysfunction of cervical region: Secondary | ICD-10-CM | POA: Diagnosis not present

## 2017-11-15 DIAGNOSIS — M47812 Spondylosis without myelopathy or radiculopathy, cervical region: Secondary | ICD-10-CM | POA: Diagnosis not present

## 2017-11-15 DIAGNOSIS — M5032 Other cervical disc degeneration, mid-cervical region, unspecified level: Secondary | ICD-10-CM | POA: Diagnosis not present

## 2017-12-12 ENCOUNTER — Emergency Department (HOSPITAL_COMMUNITY)
Admission: EM | Admit: 2017-12-12 | Discharge: 2017-12-13 | Disposition: A | Discharge status: Home / Self Care | Payer: 59 | Attending: Emergency Medicine | Admitting: Emergency Medicine

## 2017-12-12 ENCOUNTER — Other Ambulatory Visit: Payer: Self-pay

## 2017-12-12 DIAGNOSIS — E039 Hypothyroidism, unspecified: Secondary | ICD-10-CM | POA: Insufficient documentation

## 2017-12-12 DIAGNOSIS — I130 Hypertensive heart and chronic kidney disease with heart failure and stage 1 through stage 4 chronic kidney disease, or unspecified chronic kidney disease: Secondary | ICD-10-CM | POA: Diagnosis not present

## 2017-12-12 DIAGNOSIS — Y9389 Activity, other specified: Secondary | ICD-10-CM | POA: Insufficient documentation

## 2017-12-12 DIAGNOSIS — Y92009 Unspecified place in unspecified non-institutional (private) residence as the place of occurrence of the external cause: Secondary | ICD-10-CM | POA: Insufficient documentation

## 2017-12-12 DIAGNOSIS — Y999 Unspecified external cause status: Secondary | ICD-10-CM | POA: Insufficient documentation

## 2017-12-12 DIAGNOSIS — S0101XA Laceration without foreign body of scalp, initial encounter: Secondary | ICD-10-CM | POA: Diagnosis not present

## 2017-12-12 DIAGNOSIS — Z79899 Other long term (current) drug therapy: Secondary | ICD-10-CM | POA: Insufficient documentation

## 2017-12-12 DIAGNOSIS — W2201XA Walked into wall, initial encounter: Secondary | ICD-10-CM | POA: Diagnosis not present

## 2017-12-12 MED ORDER — LIDOCAINE HCL (PF) 2 % IJ SOLN
INTRAMUSCULAR | Status: AC
  Filled 2017-12-12: qty 20

## 2017-12-12 MED ORDER — TETANUS-DIPHTH-ACELL PERTUSSIS 5-2.5-18.5 LF-MCG/0.5 IM SUSP
0.5000 mL | Freq: Once | INTRAMUSCULAR | Status: AC
  Administered 2017-12-12: 0.5 mL via INTRAMUSCULAR
  Filled 2017-12-12: qty 0.5

## 2017-12-12 MED ORDER — LIDOCAINE HCL (PF) 2 % IJ SOLN
5.0000 mL | Freq: Once | INTRAMUSCULAR | Status: AC
  Administered 2017-12-12: 5 mL

## 2017-12-12 MED ORDER — POVIDONE-IODINE 10 % EX SOLN
CUTANEOUS | Status: DC | PRN
  Administered 2017-12-12: via TOPICAL
  Filled 2017-12-12: qty 15

## 2017-12-12 NOTE — ED Triage Notes (Addendum)
Pt states he hit his head up under house and has laceration. Bleeding controlled at this time. Pt denies any loc.

## 2017-12-12 NOTE — Discharge Instructions (Signed)
Have your staples removed in 10 days.  Keep your wound clean and dry,  Until a good scab forms - you may then wash your hair gently but avoid rubbing or scrubbing the wound site.   Get rechecked for any sign of infection (redness,  Swelling,  Increased pain or drainage of purulent fluid).

## 2017-12-13 NOTE — ED Provider Notes (Signed)
[facility]PART[facility] Provider Note   CSN: [id number] Arrival date & time: 12/12/17  2137     History   Chief Complaint Chief Complaint  Patient presents with  . Head Laceration    HPI Claretha CooperRobert T Giese is a 62 y.o. male presenting with laceration to his parietal scalp occurring around 6 pm tonight.  He was walking out of his crawl space which has a short door which he failed to "duck" under hitting his head on the door frame.  He denies headache, also denies dizziness, n/v , neck pain, vision changes, loc, confusion or focal weakness.  He reports applying pressure to the wound for about 5 minutes and it has been hemostatic since. He is unsure of his tetanus status. He is not on blood thinners.  The history is provided by the patient and the spouse.    Past Medical History:  Diagnosis Date  . Hypercholesteremia   . Hypothyroidism     There are no active problems to display for this patient.   Past Surgical History:  Procedure Laterality Date  . ORTHOPEDIC SURGERY     ankle  . OTHER SURGICAL HISTORY     sinus        Home Medications    Prior to Admission medications   Medication Sig Start Date End Date Taking? Authorizing Provider  Coenzyme Q10 (CO Q 10 PO) Take 1 capsule by mouth daily.     [provider]  Cyanocobalamin (B-12 PO) Take 1 tablet by mouth daily.    [provider]  fexofenadine (ALLEGRA) 180 MG tablet Take 180 mg by mouth daily.    [provider]  levothyroxine (SYNTHROID, LEVOTHROID) 88 MCG tablet Take 1 tablet by mouth daily. 06/28/15   [provider]  lisinopril (PRINIVIL,ZESTRIL) 5 MG tablet Take 1 tablet by mouth daily. 06/28/15   [provider]  Magnesium Hydroxide (MAGNESIA PO) Take 1 tablet by mouth daily.    [provider]  Multiple Vitamin (MULTIVITAMIN WITH MINERALS) TABS tablet Take 1 tablet by mouth daily.    [provider]  Omega-3 Fatty Acids (FISH OIL) 1000 MG  CAPS Take 1 capsule by mouth daily.    [provider]  pravastatin (PRAVACHOL) 80 MG tablet Take 80 mg by mouth at bedtime.  01/25/13   [provider]  PROAIR HFA 108 (90 Base) MCG/ACT inhaler Inhale 2 puffs into the lungs every 4 (four) hours as needed. 07/01/15   [provider]  vitamin C (ASCORBIC ACID) 500 MG tablet Take 500 mg by mouth daily.    [provider]  zinc gluconate 50 MG tablet Take 50 mg by mouth daily.    [provider]    Family History Family History  Problem Relation Age of Onset  . Heart disease Mother   . Heart attack Mother   . Heart disease Father   . Heart attack Father   . Heart attack Sister   . Heart attack Sister     Social History Social History   Tobacco Use  . Smoking status: Never Smoker  . Smokeless tobacco: Never Used  Substance Use Topics  . Alcohol use: Yes    Alcohol/week: 0.0 oz  . Drug use: No     Allergies   Ceclor [cefaclor]   Review of Systems Review of Systems  Constitutional: Negative for chills and fever.  HENT: Negative.   Gastrointestinal: Negative for nausea and vomiting.  Musculoskeletal: Negative for neck pain.  Skin: Positive for wound.  Neurological: Negative for syncope, numbness and headaches.  Psychiatric/Behavioral: Negative for confusion.     Physical Exam Updated Vital Signs BP 126/78   Pulse (!) 52   Temp 98 F (36.7 C)   Resp 17   Ht 5\' 5"  (1.651 m)   Wt 84.4 kg (186 lb)   SpO2 96%   BMI 30.95 kg/m   Physical Exam  Constitutional: He is oriented to person, place, and time. He appears well-developed and well-nourished.  HENT:  Head: Normocephalic.  Cardiovascular: Normal rate.  Pulmonary/Chest: Effort normal.  Neurological: He is alert and oriented to person, place, and time. No cranial nerve deficit or sensory deficit. Coordination normal.  Skin: Laceration noted.  4 cm, well approximated, hemostatic linear laceration into but not through  the subcutaneous layers if parietal scalp.  No deeper structures visualized. No hematoma.       ED Treatments / Results  Labs (all labs ordered are listed, but only abnormal results are displayed) Labs Reviewed - No data to display  EKG None  Radiology No results found.  Procedures Procedures (including critical care time)  LACERATION REPAIR Performed by: Burgess Amor Authorized by: Burgess Amor Consent: Verbal consent obtained. Risks and benefits: risks, benefits and alternatives were discussed Consent given by: patient Patient identity confirmed: provided demographic data Prepped and Draped in normal sterile fashion Wound explored  Laceration Location: scalp  Laceration Length: 4cm  No Foreign Bodies seen or palpated  Anesthesia: local infiltration  Local anesthetic: lidocaine lidocaine% without epinephrine  Anesthetic total: 3 ml  Irrigation method: syringe Amount of cleaning: standard  Skin closure: staples  Number of sutures: #3 staples  Technique: staples  Patient tolerance: Patient tolerated the procedure well with no immediate complications.   Medications Ordered in ED Medications  povidone-iodine (BETADINE) 10 % external solution ( Topical Given 12/12/17 2347)  lidocaine (XYLOCAINE) 2 % injection (has no administration in time range)  Tdap (BOOSTRIX) injection 0.5 mL (0.5 mLs Intramuscular Given 12/12/17 2346)  lidocaine (XYLOCAINE) 2 % injection 5 mL (5 mLs Other Given 12/12/17 2347)     Initial Impression / Assessment and Plan / ED Course  I have reviewed the triage vital signs and the nursing notes.  Pertinent labs & imaging results that were available during my care of the patient were reviewed by me and considered in my medical decision making (see chart for details).     Wound care instructions given.  Pt advised to have staples removed in 10 days,  Return here sooner for any signs of infection including redness, swelling, worse pain or  drainage of pus.  Pt has no sx to suggest intracranial injury with injury occurring 5.5 to 6 hours before arrival. Pt does not need imaging as he is asymptomatic and has been since the injury.     Final Clinical Impressions(s) / ED Diagnoses   Final diagnoses:  Laceration of scalp, initial encounter    ED Discharge Orders    None       Victoriano Lain 12/13/17 0206    Dione Booze, MD 12/13/17 [phone]

## 2017-12-14 DIAGNOSIS — M5032 Other cervical disc degeneration, mid-cervical region, unspecified level: Secondary | ICD-10-CM | POA: Diagnosis not present

## 2017-12-14 DIAGNOSIS — M47812 Spondylosis without myelopathy or radiculopathy, cervical region: Secondary | ICD-10-CM | POA: Diagnosis not present

## 2017-12-14 DIAGNOSIS — M9901 Segmental and somatic dysfunction of cervical region: Secondary | ICD-10-CM | POA: Diagnosis not present

## 2017-12-22 DIAGNOSIS — E039 Hypothyroidism, unspecified: Secondary | ICD-10-CM | POA: Diagnosis not present

## 2017-12-22 DIAGNOSIS — Z4802 Encounter for removal of sutures: Secondary | ICD-10-CM | POA: Diagnosis not present

## 2017-12-22 DIAGNOSIS — E782 Mixed hyperlipidemia: Secondary | ICD-10-CM | POA: Diagnosis not present

## 2017-12-22 DIAGNOSIS — I10 Essential (primary) hypertension: Secondary | ICD-10-CM | POA: Diagnosis not present

## 2017-12-27 DIAGNOSIS — I10 Essential (primary) hypertension: Secondary | ICD-10-CM | POA: Diagnosis not present

## 2017-12-27 DIAGNOSIS — E039 Hypothyroidism, unspecified: Secondary | ICD-10-CM | POA: Diagnosis not present

## 2017-12-27 DIAGNOSIS — E782 Mixed hyperlipidemia: Secondary | ICD-10-CM | POA: Diagnosis not present

## 2018-01-11 DIAGNOSIS — M5032 Other cervical disc degeneration, mid-cervical region, unspecified level: Secondary | ICD-10-CM | POA: Diagnosis not present

## 2018-01-11 DIAGNOSIS — M9901 Segmental and somatic dysfunction of cervical region: Secondary | ICD-10-CM | POA: Diagnosis not present

## 2018-01-11 DIAGNOSIS — M47812 Spondylosis without myelopathy or radiculopathy, cervical region: Secondary | ICD-10-CM | POA: Diagnosis not present

## 2018-02-08 DIAGNOSIS — M9901 Segmental and somatic dysfunction of cervical region: Secondary | ICD-10-CM | POA: Diagnosis not present

## 2018-02-08 DIAGNOSIS — M5032 Other cervical disc degeneration, mid-cervical region, unspecified level: Secondary | ICD-10-CM | POA: Diagnosis not present

## 2018-02-08 DIAGNOSIS — M47812 Spondylosis without myelopathy or radiculopathy, cervical region: Secondary | ICD-10-CM | POA: Diagnosis not present

## 2018-03-22 DIAGNOSIS — M47812 Spondylosis without myelopathy or radiculopathy, cervical region: Secondary | ICD-10-CM | POA: Diagnosis not present

## 2018-03-22 DIAGNOSIS — M5032 Other cervical disc degeneration, mid-cervical region, unspecified level: Secondary | ICD-10-CM | POA: Diagnosis not present

## 2018-03-22 DIAGNOSIS — M9901 Segmental and somatic dysfunction of cervical region: Secondary | ICD-10-CM | POA: Diagnosis not present

## 2018-03-28 DIAGNOSIS — Z23 Encounter for immunization: Secondary | ICD-10-CM | POA: Diagnosis not present

## 2018-04-19 DIAGNOSIS — M5032 Other cervical disc degeneration, mid-cervical region, unspecified level: Secondary | ICD-10-CM | POA: Diagnosis not present

## 2018-04-19 DIAGNOSIS — M47812 Spondylosis without myelopathy or radiculopathy, cervical region: Secondary | ICD-10-CM | POA: Diagnosis not present

## 2018-04-19 DIAGNOSIS — M9901 Segmental and somatic dysfunction of cervical region: Secondary | ICD-10-CM | POA: Diagnosis not present

## 2018-05-17 DIAGNOSIS — M47812 Spondylosis without myelopathy or radiculopathy, cervical region: Secondary | ICD-10-CM | POA: Diagnosis not present

## 2018-05-17 DIAGNOSIS — M9901 Segmental and somatic dysfunction of cervical region: Secondary | ICD-10-CM | POA: Diagnosis not present

## 2018-05-17 DIAGNOSIS — M5032 Other cervical disc degeneration, mid-cervical region, unspecified level: Secondary | ICD-10-CM | POA: Diagnosis not present

## 2018-05-30 DIAGNOSIS — R0981 Nasal congestion: Secondary | ICD-10-CM | POA: Diagnosis not present

## 2018-05-30 DIAGNOSIS — R05 Cough: Secondary | ICD-10-CM | POA: Diagnosis not present

## 2018-05-30 DIAGNOSIS — J060 Acute laryngopharyngitis: Secondary | ICD-10-CM | POA: Diagnosis not present

## 2018-06-29 DIAGNOSIS — E782 Mixed hyperlipidemia: Secondary | ICD-10-CM | POA: Diagnosis not present

## 2018-06-29 DIAGNOSIS — E039 Hypothyroidism, unspecified: Secondary | ICD-10-CM | POA: Diagnosis not present

## 2018-06-29 DIAGNOSIS — R7301 Impaired fasting glucose: Secondary | ICD-10-CM | POA: Diagnosis not present

## 2018-06-29 DIAGNOSIS — Z125 Encounter for screening for malignant neoplasm of prostate: Secondary | ICD-10-CM | POA: Diagnosis not present

## 2018-06-29 DIAGNOSIS — I10 Essential (primary) hypertension: Secondary | ICD-10-CM | POA: Diagnosis not present

## 2018-07-04 DIAGNOSIS — Z0000 Encounter for general adult medical examination without abnormal findings: Secondary | ICD-10-CM | POA: Diagnosis not present

## 2018-07-04 DIAGNOSIS — E039 Hypothyroidism, unspecified: Secondary | ICD-10-CM | POA: Diagnosis not present

## 2018-07-04 DIAGNOSIS — I10 Essential (primary) hypertension: Secondary | ICD-10-CM | POA: Diagnosis not present

## 2018-07-12 DIAGNOSIS — M5032 Other cervical disc degeneration, mid-cervical region, unspecified level: Secondary | ICD-10-CM | POA: Diagnosis not present

## 2018-07-12 DIAGNOSIS — M9901 Segmental and somatic dysfunction of cervical region: Secondary | ICD-10-CM | POA: Diagnosis not present

## 2018-07-12 DIAGNOSIS — M47812 Spondylosis without myelopathy or radiculopathy, cervical region: Secondary | ICD-10-CM | POA: Diagnosis not present

## 2019-07-02 DIAGNOSIS — M7660 Achilles tendinitis, unspecified leg: Secondary | ICD-10-CM | POA: Diagnosis not present

## 2019-07-02 DIAGNOSIS — M79644 Pain in right finger(s): Secondary | ICD-10-CM | POA: Diagnosis not present

## 2019-07-02 DIAGNOSIS — E039 Hypothyroidism, unspecified: Secondary | ICD-10-CM | POA: Diagnosis not present

## 2019-07-02 DIAGNOSIS — R7301 Impaired fasting glucose: Secondary | ICD-10-CM | POA: Diagnosis not present

## 2019-07-02 DIAGNOSIS — L03011 Cellulitis of right finger: Secondary | ICD-10-CM | POA: Diagnosis not present

## 2019-07-02 DIAGNOSIS — M545 Low back pain: Secondary | ICD-10-CM | POA: Diagnosis not present

## 2019-07-02 DIAGNOSIS — Z712 Person consulting for explanation of examination or test findings: Secondary | ICD-10-CM | POA: Diagnosis not present

## 2019-07-02 DIAGNOSIS — K6289 Other specified diseases of anus and rectum: Secondary | ICD-10-CM | POA: Diagnosis not present

## 2019-07-02 DIAGNOSIS — Z6830 Body mass index (BMI) 30.0-30.9, adult: Secondary | ICD-10-CM | POA: Diagnosis not present

## 2019-07-02 DIAGNOSIS — Z0000 Encounter for general adult medical examination without abnormal findings: Secondary | ICD-10-CM | POA: Diagnosis not present

## 2019-07-02 DIAGNOSIS — Z4802 Encounter for removal of sutures: Secondary | ICD-10-CM | POA: Diagnosis not present

## 2019-07-02 DIAGNOSIS — J019 Acute sinusitis, unspecified: Secondary | ICD-10-CM | POA: Diagnosis not present

## 2019-07-04 DIAGNOSIS — I10 Essential (primary) hypertension: Secondary | ICD-10-CM | POA: Diagnosis not present

## 2019-07-04 DIAGNOSIS — M79671 Pain in right foot: Secondary | ICD-10-CM | POA: Diagnosis not present

## 2019-07-04 DIAGNOSIS — J309 Allergic rhinitis, unspecified: Secondary | ICD-10-CM | POA: Diagnosis not present

## 2019-07-04 DIAGNOSIS — N529 Male erectile dysfunction, unspecified: Secondary | ICD-10-CM | POA: Diagnosis not present

## 2019-07-04 DIAGNOSIS — R7301 Impaired fasting glucose: Secondary | ICD-10-CM | POA: Diagnosis not present

## 2019-07-04 DIAGNOSIS — E782 Mixed hyperlipidemia: Secondary | ICD-10-CM | POA: Diagnosis not present

## 2019-07-04 DIAGNOSIS — Z0001 Encounter for general adult medical examination with abnormal findings: Secondary | ICD-10-CM | POA: Diagnosis not present

## 2019-07-04 DIAGNOSIS — R944 Abnormal results of kidney function studies: Secondary | ICD-10-CM | POA: Diagnosis not present

## 2019-07-04 DIAGNOSIS — M545 Low back pain: Secondary | ICD-10-CM | POA: Diagnosis not present

## 2019-07-04 DIAGNOSIS — E039 Hypothyroidism, unspecified: Secondary | ICD-10-CM | POA: Diagnosis not present

## 2019-12-27 DIAGNOSIS — E039 Hypothyroidism, unspecified: Secondary | ICD-10-CM | POA: Diagnosis not present

## 2019-12-27 DIAGNOSIS — J019 Acute sinusitis, unspecified: Secondary | ICD-10-CM | POA: Diagnosis not present

## 2019-12-27 DIAGNOSIS — Z4802 Encounter for removal of sutures: Secondary | ICD-10-CM | POA: Diagnosis not present

## 2019-12-27 DIAGNOSIS — Z6830 Body mass index (BMI) 30.0-30.9, adult: Secondary | ICD-10-CM | POA: Diagnosis not present

## 2019-12-27 DIAGNOSIS — M7660 Achilles tendinitis, unspecified leg: Secondary | ICD-10-CM | POA: Diagnosis not present

## 2019-12-27 DIAGNOSIS — L03011 Cellulitis of right finger: Secondary | ICD-10-CM | POA: Diagnosis not present

## 2019-12-27 DIAGNOSIS — Z0000 Encounter for general adult medical examination without abnormal findings: Secondary | ICD-10-CM | POA: Diagnosis not present

## 2019-12-27 DIAGNOSIS — Z712 Person consulting for explanation of examination or test findings: Secondary | ICD-10-CM | POA: Diagnosis not present

## 2019-12-27 DIAGNOSIS — K6289 Other specified diseases of anus and rectum: Secondary | ICD-10-CM | POA: Diagnosis not present

## 2019-12-27 DIAGNOSIS — R7301 Impaired fasting glucose: Secondary | ICD-10-CM | POA: Diagnosis not present

## 2019-12-27 DIAGNOSIS — M545 Low back pain: Secondary | ICD-10-CM | POA: Diagnosis not present

## 2019-12-27 DIAGNOSIS — M79644 Pain in right finger(s): Secondary | ICD-10-CM | POA: Diagnosis not present

## 2020-01-02 DIAGNOSIS — R944 Abnormal results of kidney function studies: Secondary | ICD-10-CM | POA: Diagnosis not present

## 2020-01-02 DIAGNOSIS — J309 Allergic rhinitis, unspecified: Secondary | ICD-10-CM | POA: Diagnosis not present

## 2020-01-02 DIAGNOSIS — I10 Essential (primary) hypertension: Secondary | ICD-10-CM | POA: Diagnosis not present

## 2020-01-02 DIAGNOSIS — Z0001 Encounter for general adult medical examination with abnormal findings: Secondary | ICD-10-CM | POA: Diagnosis not present

## 2020-01-02 DIAGNOSIS — E039 Hypothyroidism, unspecified: Secondary | ICD-10-CM | POA: Diagnosis not present

## 2020-01-02 DIAGNOSIS — E782 Mixed hyperlipidemia: Secondary | ICD-10-CM | POA: Diagnosis not present

## 2020-01-02 DIAGNOSIS — N529 Male erectile dysfunction, unspecified: Secondary | ICD-10-CM | POA: Diagnosis not present

## 2020-01-02 DIAGNOSIS — R7301 Impaired fasting glucose: Secondary | ICD-10-CM | POA: Diagnosis not present

## 2020-01-02 DIAGNOSIS — M545 Low back pain: Secondary | ICD-10-CM | POA: Diagnosis not present

## 2020-01-02 DIAGNOSIS — M79671 Pain in right foot: Secondary | ICD-10-CM | POA: Diagnosis not present

## 2020-05-06 DIAGNOSIS — Z129 Encounter for screening for malignant neoplasm, site unspecified: Secondary | ICD-10-CM | POA: Diagnosis not present

## 2020-05-06 DIAGNOSIS — E559 Vitamin D deficiency, unspecified: Secondary | ICD-10-CM | POA: Diagnosis not present

## 2020-05-06 DIAGNOSIS — R7303 Prediabetes: Secondary | ICD-10-CM | POA: Diagnosis not present

## 2020-05-06 DIAGNOSIS — E039 Hypothyroidism, unspecified: Secondary | ICD-10-CM | POA: Diagnosis not present

## 2020-05-06 DIAGNOSIS — N400 Enlarged prostate without lower urinary tract symptoms: Secondary | ICD-10-CM | POA: Diagnosis not present

## 2020-05-06 DIAGNOSIS — E291 Testicular hypofunction: Secondary | ICD-10-CM | POA: Diagnosis not present

## 2020-05-13 DIAGNOSIS — J069 Acute upper respiratory infection, unspecified: Secondary | ICD-10-CM | POA: Diagnosis not present

## 2020-05-13 DIAGNOSIS — J0100 Acute maxillary sinusitis, unspecified: Secondary | ICD-10-CM | POA: Diagnosis not present

## 2020-05-13 DIAGNOSIS — I10 Essential (primary) hypertension: Secondary | ICD-10-CM | POA: Diagnosis not present

## 2020-05-13 DIAGNOSIS — M7660 Achilles tendinitis, unspecified leg: Secondary | ICD-10-CM | POA: Diagnosis not present

## 2020-05-13 DIAGNOSIS — J019 Acute sinusitis, unspecified: Secondary | ICD-10-CM | POA: Diagnosis not present

## 2020-05-13 DIAGNOSIS — M79644 Pain in right finger(s): Secondary | ICD-10-CM | POA: Diagnosis not present

## 2020-05-13 DIAGNOSIS — R7301 Impaired fasting glucose: Secondary | ICD-10-CM | POA: Diagnosis not present

## 2020-05-13 DIAGNOSIS — E039 Hypothyroidism, unspecified: Secondary | ICD-10-CM | POA: Diagnosis not present

## 2020-05-13 DIAGNOSIS — Z0000 Encounter for general adult medical examination without abnormal findings: Secondary | ICD-10-CM | POA: Diagnosis not present

## 2020-05-13 DIAGNOSIS — Z4802 Encounter for removal of sutures: Secondary | ICD-10-CM | POA: Diagnosis not present

## 2020-05-13 DIAGNOSIS — Z712 Person consulting for explanation of examination or test findings: Secondary | ICD-10-CM | POA: Diagnosis not present

## 2020-05-13 DIAGNOSIS — L03011 Cellulitis of right finger: Secondary | ICD-10-CM | POA: Diagnosis not present

## 2020-05-13 DIAGNOSIS — Z6830 Body mass index (BMI) 30.0-30.9, adult: Secondary | ICD-10-CM | POA: Diagnosis not present

## 2020-05-13 DIAGNOSIS — K6289 Other specified diseases of anus and rectum: Secondary | ICD-10-CM | POA: Diagnosis not present

## 2020-06-03 DIAGNOSIS — J0100 Acute maxillary sinusitis, unspecified: Secondary | ICD-10-CM | POA: Diagnosis not present

## 2020-06-03 DIAGNOSIS — J069 Acute upper respiratory infection, unspecified: Secondary | ICD-10-CM | POA: Diagnosis not present

## 2020-06-11 DIAGNOSIS — Z712 Person consulting for explanation of examination or test findings: Secondary | ICD-10-CM | POA: Diagnosis not present

## 2020-06-11 DIAGNOSIS — L03011 Cellulitis of right finger: Secondary | ICD-10-CM | POA: Diagnosis not present

## 2020-06-11 DIAGNOSIS — J019 Acute sinusitis, unspecified: Secondary | ICD-10-CM | POA: Diagnosis not present

## 2020-06-11 DIAGNOSIS — Z4802 Encounter for removal of sutures: Secondary | ICD-10-CM | POA: Diagnosis not present

## 2020-06-11 DIAGNOSIS — M79644 Pain in right finger(s): Secondary | ICD-10-CM | POA: Diagnosis not present

## 2020-06-11 DIAGNOSIS — K6289 Other specified diseases of anus and rectum: Secondary | ICD-10-CM | POA: Diagnosis not present

## 2020-06-11 DIAGNOSIS — U071 COVID-19: Secondary | ICD-10-CM | POA: Diagnosis not present

## 2020-06-11 DIAGNOSIS — Z6830 Body mass index (BMI) 30.0-30.9, adult: Secondary | ICD-10-CM | POA: Diagnosis not present

## 2020-06-11 DIAGNOSIS — Z0000 Encounter for general adult medical examination without abnormal findings: Secondary | ICD-10-CM | POA: Diagnosis not present

## 2020-06-11 DIAGNOSIS — J069 Acute upper respiratory infection, unspecified: Secondary | ICD-10-CM | POA: Diagnosis not present

## 2020-06-11 DIAGNOSIS — J0100 Acute maxillary sinusitis, unspecified: Secondary | ICD-10-CM | POA: Diagnosis not present

## 2020-06-11 DIAGNOSIS — R7301 Impaired fasting glucose: Secondary | ICD-10-CM | POA: Diagnosis not present

## 2020-06-16 DIAGNOSIS — U071 COVID-19: Secondary | ICD-10-CM | POA: Diagnosis not present

## 2020-06-16 DIAGNOSIS — J0100 Acute maxillary sinusitis, unspecified: Secondary | ICD-10-CM | POA: Diagnosis not present

## 2020-06-16 DIAGNOSIS — J209 Acute bronchitis, unspecified: Secondary | ICD-10-CM | POA: Diagnosis not present

## 2020-06-16 DIAGNOSIS — J069 Acute upper respiratory infection, unspecified: Secondary | ICD-10-CM | POA: Diagnosis not present

## 2020-06-17 ENCOUNTER — Emergency Department (HOSPITAL_COMMUNITY)
Admission: EM | Admit: 2020-06-17 | Discharge: 2020-06-18 | Disposition: A | Discharge status: Home / Self Care | Payer: PPO | Attending: Emergency Medicine | Admitting: Emergency Medicine

## 2020-06-17 ENCOUNTER — Other Ambulatory Visit: Payer: Self-pay

## 2020-06-17 ENCOUNTER — Encounter (HOSPITAL_COMMUNITY): Payer: Self-pay | Admitting: Emergency Medicine

## 2020-06-17 ENCOUNTER — Emergency Department (HOSPITAL_COMMUNITY): Payer: PPO

## 2020-06-17 DIAGNOSIS — R0602 Shortness of breath: Secondary | ICD-10-CM | POA: Diagnosis not present

## 2020-06-17 DIAGNOSIS — J1282 Pneumonia due to coronavirus disease 2019: Secondary | ICD-10-CM | POA: Diagnosis not present

## 2020-06-17 DIAGNOSIS — Z79899 Other long term (current) drug therapy: Secondary | ICD-10-CM | POA: Diagnosis not present

## 2020-06-17 DIAGNOSIS — U071 COVID-19: Secondary | ICD-10-CM | POA: Insufficient documentation

## 2020-06-17 DIAGNOSIS — E039 Hypothyroidism, unspecified: Secondary | ICD-10-CM | POA: Diagnosis not present

## 2020-06-17 MED ORDER — ALBUTEROL SULFATE HFA 108 (90 BASE) MCG/ACT IN AERS: 2.0000 | INHALATION_SPRAY | RESPIRATORY_TRACT | Status: DC | PRN

## 2020-06-17 NOTE — ED Triage Notes (Signed)
Pt states he was diagnosed with Covid 1 week ago. States he has a productive cough with clear sputum. States he became extremely SOB @ home while watching TV and "though he'd better get up here". Pt states he saw his doctor recently and was told he either has "bronchitis or a tough of Pneumonia".

## 2020-06-18 DIAGNOSIS — R0602 Shortness of breath: Secondary | ICD-10-CM | POA: Diagnosis not present

## 2020-06-18 DIAGNOSIS — U071 COVID-19: Secondary | ICD-10-CM | POA: Diagnosis not present

## 2020-06-18 LAB — TROPONIN I (HIGH SENSITIVITY)
Troponin I (High Sensitivity): 6 ng/L (ref <18)
Troponin I (High Sensitivity): 6 ng/L (ref <18)

## 2020-06-18 LAB — CBC
HCT: 37.6 % — ABNORMAL LOW (ref 39.0–52.0)
Hemoglobin: 12.7 g/dL — ABNORMAL LOW (ref 13.0–17.0)
MCH: 31.5 pg (ref 26.0–34.0)
MCHC: 33.8 g/dL (ref 30.0–36.0)
MCV: 93.3 fL (ref 80.0–100.0)
Platelets: 168 10*3/uL (ref 150–400)
RBC: 4.03 MIL/uL — ABNORMAL LOW (ref 4.22–5.81)
RDW: 13.8 % (ref 11.5–15.5)
WBC: 8.6 10*3/uL (ref 4.0–10.5)
nRBC: 0 % (ref 0.0–0.2)

## 2020-06-18 LAB — BASIC METABOLIC PANEL
Anion gap: 7 (ref 5–15)
BUN: 18 mg/dL (ref 8–23)
CO2: 22 mmol/L (ref 22–32)
Calcium: 8.5 mg/dL — ABNORMAL LOW (ref 8.9–10.3)
Chloride: 103 mmol/L (ref 98–111)
Creatinine, Ser: 1.38 mg/dL — ABNORMAL HIGH (ref 0.61–1.24)
GFR, Estimated: 56 mL/min — ABNORMAL LOW (ref >60)
Glucose, Bld: 151 mg/dL — ABNORMAL HIGH (ref 70–99)
Potassium: 3.7 mmol/L (ref 3.5–5.1)
Sodium: 132 mmol/L — ABNORMAL LOW (ref 135–145)

## 2020-06-18 LAB — SARS CORONAVIRUS 2 (TAT 6-24 HRS): SARS Coronavirus 2: POSITIVE — AB

## 2020-06-18 MED ORDER — SODIUM CHLORIDE 0.9 % IV SOLN
100.0000 mg | INTRAVENOUS | Status: AC
  Administered 2020-06-18 (×2): 100 mg via INTRAVENOUS
  Filled 2020-06-18 (×2): qty 20

## 2020-06-18 MED ORDER — SODIUM CHLORIDE 0.9 % IV SOLN: 100.0000 mg | Freq: Every day | INTRAVENOUS | Status: DC

## 2020-06-18 NOTE — ED Provider Notes (Signed)
[facility] EMERGENCY DEPARTMENT Provider Note   CSN: [id number] Arrival date & time: 06/17/20  2305     History Chief Complaint  Patient presents with  . Shortness of Breath    [name] is a 65 y.o. male.  The history is provided by the patient.  Shortness of Breath Severity:  Moderate Onset quality:  Gradual Timing:  Intermittent Progression:  Worsening Chronicity:  New Relieved by:  Nothing Worsened by:  Coughing Associated symptoms: cough and sputum production   Associated symptoms: no chest pain, no fever, no hemoptysis and no vomiting   Patient with history hyperlipidemia, hypothyroid presents with cough and shortness of breath with recent diagnosis of Covid.  Patient reports approximately 8 days ago began having cough, fever and body aches.  His PCP diagnosed him with Covid in the office. Over the next several days he began to improve, but did have persistent cough.  His PCP prescribed him Augmentin, albuterol inhaler for "touch of pneumonia " Over the past day he reports his cough is worsening.  He reports now that his cough worsens the shortness of breath No hemoptysis.  No chest pain. Patient has had 2 vaccines for COVID-19    Past Medical History:  Diagnosis Date  . Hypercholesteremia   . Hypothyroidism     There are no problems to display for this patient.   Past Surgical History:  Procedure Laterality Date  . ORTHOPEDIC SURGERY     ankle  . OTHER SURGICAL HISTORY     sinus       Family History  Problem Relation Age of Onset  . Heart disease Mother   . Heart attack Mother   . Heart disease Father   . Heart attack Father   . Heart attack Sister   . Heart attack Sister     Social History   Tobacco Use  . Smoking status: Never Smoker  . Smokeless tobacco: Never Used  Substance Use Topics  . Alcohol use: Yes    Alcohol/week: 0.0 standard drinks  . Drug use: No    Home Medications Prior to Admission medications   Medication  Sig Start Date End Date Taking? Authorizing Provider  Coenzyme Q10 (CO Q 10 PO) Take 1 capsule by mouth daily.     [provider]  Cyanocobalamin (B-12 PO) Take 1 tablet by mouth daily.    [provider]  fexofenadine (ALLEGRA) 180 MG tablet Take 180 mg by mouth daily.    [provider]  levothyroxine (SYNTHROID, LEVOTHROID) 88 MCG tablet Take 1 tablet by mouth daily. 06/28/15   [provider]  lisinopril (PRINIVIL,ZESTRIL) 5 MG tablet Take 1 tablet by mouth daily. 06/28/15   [provider]  Magnesium Hydroxide (MAGNESIA PO) Take 1 tablet by mouth daily.    [provider]  Multiple Vitamin (MULTIVITAMIN WITH MINERALS) TABS tablet Take 1 tablet by mouth daily.    [provider]  Omega-3 Fatty Acids (FISH OIL) 1000 MG CAPS Take 1 capsule by mouth daily.    [provider]  pravastatin (PRAVACHOL) 80 MG tablet Take 80 mg by mouth at bedtime.  01/25/13   [provider]  PROAIR HFA 108 (90 Base) MCG/ACT inhaler Inhale 2 puffs into the lungs every 4 (four) hours as needed. 07/01/15   [provider]  vitamin C (ASCORBIC ACID) 500 MG tablet Take 500 mg by mouth daily.    [provider]  zinc gluconate 50 MG tablet Take 50 mg by  mouth daily.    [provider]    Allergies    Ceclor [cefaclor]  Review of Systems   Review of Systems  Constitutional: Negative for fever.  Respiratory: Positive for cough, sputum production and shortness of breath. Negative for hemoptysis.   Cardiovascular: Negative for chest pain.  Gastrointestinal: Negative for vomiting.  All other systems reviewed and are negative.   Physical Exam Updated Vital Signs BP 137/79   Pulse 71   Temp 98.1 F (36.7 C) (Oral)   Resp 19   Ht 1.651 m (5\' 5" )   Wt 79.4 kg   SpO2 95%   BMI 29.12 kg/m   Physical Exam CONSTITUTIONAL: Well developed/well nourished HEAD: Normocephalic/atraumatic EYES: EOMI/PERRL ENMT:  Mask in place NECK: supple no meningeal signs SPINE/BACK:entire spine nontender CV: S1/S2 noted, no murmurs/rubs/gallops noted LUNGS: Lungs are clear to auscultation bilaterally, no apparent distress ABDOMEN: soft, nontender, no rebound or guarding, bowel sounds noted throughout abdomen GU:no cva tenderness NEURO: Pt is awake/alert/appropriate, moves all extremitiesx4.  No facial droop.   EXTREMITIES: pulses normal/equal, full ROM SKIN: warm, color normal PSYCH: no abnormalities of mood noted, alert and oriented to situation  ED Results / Procedures / Treatments   Labs (all labs ordered are listed, but only abnormal results are displayed) Labs Reviewed  BASIC METABOLIC PANEL - Abnormal; Notable for the following components:      Result Value   Sodium 132 (*)    Glucose, Bld 151 (*)    Creatinine, Ser 1.38 (*)    Calcium 8.5 (*)    GFR, Estimated 56 (*)    All other components within normal limits  CBC - Abnormal; Notable for the following components:   RBC 4.03 (*)    Hemoglobin 12.7 (*)    HCT 37.6 (*)    All other components within normal limits  SARS CORONAVIRUS 2 (TAT 6-24 HRS)  TROPONIN I (HIGH SENSITIVITY)  TROPONIN I (HIGH SENSITIVITY)    EKG EKG Interpretation  Date/Time:  Tuesday June 17 2020 23:18:36 EST Ventricular Rate:  77 PR Interval:    QRS Duration: 97 QT Interval:  374 QTC Calculation: 424 R Axis:   14 Text Interpretation: Sinus rhythm No previous ECGs available Confirmed by [id number] (Zadie Rhine) on 06/18/2020 12:20:29 AM   Radiology DG Chest Portable 1 View  Result Date: 06/18/2020 CLINICAL DATA:  Shortness of breath COVID positive EXAM: PORTABLE CHEST 1 VIEW COMPARISON:  None. FINDINGS: The heart size and mediastinal contours are within normal limits. Hazy airspace opacity seen within both lower lungs. No pleural effusion. No acute osseous abnormality. IMPRESSION: Hazy airspace opacity in both lower lungs which is likely due to multifocal  infectious etiology. Electronically Signed   By: 08/16/2020 M.D.   On: 06/18/2020 00:27    Procedures Procedures   Medications Ordered in ED Medications  albuterol (VENTOLIN HFA) 108 (90 Base) MCG/ACT inhaler 2 puff (has no administration in time range)    ED Course  I have reviewed the triage vital signs and the nursing notes.  Pertinent labs & imaging results that were available during my care of the patient were reviewed by me and considered in my medical decision making (see chart for details).    MDM Rules/Calculators/A&P                          Pt well appearing No hypoxia No CP reported Will d/c home and referral to outpatient remdesivir infusion center  [name] was evaluated in Emergency Department on 06/18/2020 for the symptoms described in the history of present illness. He was evaluated in the context of the global COVID-19 pandemic, which necessitated consideration that the patient might be at risk for infection with the SARS-CoV-2 virus that causes COVID-19. Institutional protocols and algorithms that pertain to the evaluation of patients at risk for COVID-19 are in a state of rapid change based on information released by regulatory bodies including the CDC and federal and state organizations. These policies and algorithms were followed during the patient's care in the ED.  Final Clinical Impression(s) / ED Diagnoses Final diagnoses:  Pneumonia due to COVID-19 virus    Rx / DC Orders ED Discharge Orders         Ordered    Ambulatory referral for Covid Treatment        06/18/20 0334           Zadie Rhine, MD 06/18/20 [phone]

## 2020-06-18 NOTE — ED Notes (Addendum)
Pt ambulated in room on pulse ox. Oxygen maintained at 97 percent on room air. Pt reports shortness of breath happens only when having a coughing spell and it becomes difficult to catch his breath after. Pt reports coughing ups some sputum at home with the initial episode of SOB that followed coughing. Pt had some coughing once sitting on bed. Oxygen remained between 94-97 following coughing.

## 2020-06-19 ENCOUNTER — Ambulatory Visit (HOSPITAL_COMMUNITY)
Admission: RE | Admit: 2020-06-19 | Discharge: 2020-06-19 | Disposition: A | Discharge status: Home / Self Care | Payer: PPO | Source: Ambulatory Visit | Attending: Pulmonary Disease | Admitting: Pulmonary Disease

## 2020-06-19 DIAGNOSIS — U071 COVID-19: Secondary | ICD-10-CM | POA: Diagnosis not present

## 2020-06-19 DIAGNOSIS — J1282 Pneumonia due to coronavirus disease 2019: Secondary | ICD-10-CM | POA: Insufficient documentation

## 2020-06-19 MED ORDER — FAMOTIDINE IN NACL 20-0.9 MG/50ML-% IV SOLN: 20.0000 mg | Freq: Once | INTRAVENOUS | Status: DC | PRN

## 2020-06-19 MED ORDER — EPINEPHRINE 0.3 MG/0.3ML IJ SOAJ: 0.3000 mg | Freq: Once | INTRAMUSCULAR | Status: DC | PRN

## 2020-06-19 MED ORDER — SODIUM CHLORIDE 0.9 % IV SOLN: INTRAVENOUS | Status: DC | PRN

## 2020-06-19 MED ORDER — SODIUM CHLORIDE 0.9 % IV SOLN
100.0000 mg | Freq: Once | INTRAVENOUS | Status: AC
  Administered 2020-06-19: 100 mg via INTRAVENOUS

## 2020-06-19 MED ORDER — METHYLPREDNISOLONE SODIUM SUCC 125 MG IJ SOLR: 125.0000 mg | Freq: Once | INTRAMUSCULAR | Status: DC | PRN

## 2020-06-19 MED ORDER — DIPHENHYDRAMINE HCL 50 MG/ML IJ SOLN: 50.0000 mg | Freq: Once | INTRAMUSCULAR | Status: DC | PRN

## 2020-06-19 MED ORDER — ALBUTEROL SULFATE HFA 108 (90 BASE) MCG/ACT IN AERS: 2.0000 | INHALATION_SPRAY | Freq: Once | RESPIRATORY_TRACT | Status: DC | PRN

## 2020-06-19 NOTE — Discharge Instructions (Signed)
10 Things You Can Do to Manage Your COVID-19 Symptoms at Home °If you have possible or confirmed COVID-19: °1. Stay home except to get medical care. °2. Monitor your symptoms carefully. If your symptoms get worse, call your healthcare provider immediately. °3. Get rest and stay hydrated. °4. If you have a medical appointment, call the healthcare provider ahead of time and tell them that you have or may have COVID-19. °5. For medical emergencies, call 911 and notify the dispatch personnel that you have or may have COVID-19. °6. Cover your cough and sneezes with a tissue or use the inside of your elbow. °7. Wash your hands often with soap and water for at least 20 seconds or clean your hands with an alcohol-based hand sanitizer that contains at least 60% alcohol. °8. As much as possible, stay in a specific room and away from other people in your home. Also, you should use a separate bathroom, if available. If you need to be around other people in or outside of the home, wear a mask. °9. Avoid sharing personal items with other people in your household, like dishes, towels, and bedding. °10. Clean all surfaces that are touched often, like counters, tabletops, and doorknobs. Use household cleaning sprays or wipes according to the label instructions. °cdc.gov/coronavirus °11/23/2019 °This information is not intended to replace advice given to you by your health care provider. Make sure you discuss any questions you have with your health care provider. °Document Revised: 03/10/2020 Document Reviewed: 03/10/2020 °Elsevier Patient Education © 2021 [employer] °If you have any questions or concerns after the infusion please call the Advanced Practice Provider on call at [phone]. This number is ONLY intended for your use regarding questions or concerns about the infusion post-treatment side-effects.  Please do not provide this number to others for use. For return to work notes please contact your primary care provider.   ° °If someone you know is interested in receiving treatment please have them contact their MD for a referral or visit [email or web address] ° ° ° °

## 2020-06-19 NOTE — Progress Notes (Signed)
Patient reviewed Fact Sheet for Patients, Parents, and Caregivers for Emergency Use Authorization (EUA) of remdesivir for the Treatment of Coronavirus. Patient also reviewed and is agreeable to the estimated cost of treatment. Patient is agreeable to proceed.    

## 2020-06-19 NOTE — Discharge Summary (Signed)
  Diagnosis: COVID-19  Physician: Delford Field   Procedure: [facility] Med: remdesivir infusion - Provided patient with remdesivir fact sheet for patients, parents and caregivers prior to infusion.  Complications: No immediate complications noted.  Discharge: Discharged home   [name] 06/19/2020

## 2020-06-20 ENCOUNTER — Ambulatory Visit (HOSPITAL_COMMUNITY)
Admission: RE | Admit: 2020-06-20 | Discharge: 2020-06-20 | Disposition: A | Discharge status: Home / Self Care | Payer: PPO | Source: Ambulatory Visit | Attending: Pulmonary Disease | Admitting: Pulmonary Disease

## 2020-06-20 DIAGNOSIS — Z09 Encounter for follow-up examination after completed treatment for conditions other than malignant neoplasm: Secondary | ICD-10-CM | POA: Diagnosis not present

## 2020-06-20 DIAGNOSIS — J1281 Pneumonia due to SARS-associated coronavirus: Secondary | ICD-10-CM | POA: Diagnosis not present

## 2020-06-20 DIAGNOSIS — U071 COVID-19: Secondary | ICD-10-CM | POA: Diagnosis not present

## 2020-06-20 DIAGNOSIS — J0100 Acute maxillary sinusitis, unspecified: Secondary | ICD-10-CM | POA: Diagnosis not present

## 2020-06-20 DIAGNOSIS — J209 Acute bronchitis, unspecified: Secondary | ICD-10-CM | POA: Diagnosis not present

## 2020-06-20 DIAGNOSIS — J069 Acute upper respiratory infection, unspecified: Secondary | ICD-10-CM | POA: Diagnosis not present

## 2020-06-20 MED ORDER — SODIUM CHLORIDE 0.9 % IV SOLN
100.0000 mg | Freq: Once | INTRAVENOUS | Status: AC
  Administered 2020-06-20: 100 mg via INTRAVENOUS

## 2020-06-20 MED ORDER — EPINEPHRINE 0.3 MG/0.3ML IJ SOAJ: 0.3000 mg | Freq: Once | INTRAMUSCULAR | Status: DC | PRN

## 2020-06-20 MED ORDER — DIPHENHYDRAMINE HCL 50 MG/ML IJ SOLN: 50.0000 mg | Freq: Once | INTRAMUSCULAR | Status: DC | PRN

## 2020-06-20 MED ORDER — ALBUTEROL SULFATE HFA 108 (90 BASE) MCG/ACT IN AERS: 2.0000 | INHALATION_SPRAY | Freq: Once | RESPIRATORY_TRACT | Status: DC | PRN

## 2020-06-20 MED ORDER — SODIUM CHLORIDE 0.9 % IV SOLN: INTRAVENOUS | Status: DC | PRN

## 2020-06-20 MED ORDER — METHYLPREDNISOLONE SODIUM SUCC 125 MG IJ SOLR: 125.0000 mg | Freq: Once | INTRAMUSCULAR | Status: DC | PRN

## 2020-06-20 MED ORDER — FAMOTIDINE IN NACL 20-0.9 MG/50ML-% IV SOLN: 20.0000 mg | Freq: Once | INTRAVENOUS | Status: DC | PRN

## 2020-06-20 NOTE — Progress Notes (Signed)
Patient reviewed Fact Sheet for Patients, Parents, and Caregivers for Emergency Use Authorization (EUA) of remdesivir for the Treatment of Coronavirus. Patient also reviewed and is agreeable to the estimated cost of treatment. Patient is agreeable to proceed.    

## 2020-06-20 NOTE — Discharge Instructions (Signed)

## 2020-06-20 NOTE — Progress Notes (Signed)
  Diagnosis: COVID-19  Physician:Dr Wright   Procedure: [facility] Med: remdesivir infusion - Provided patient with remdesivir fact sheet for patients, parents and caregivers prior to infusion.  Complications: No immediate complications noted.  Discharge: Discharged home   Leora Platt W 06/20/2020  

## 2020-06-27 DIAGNOSIS — Z09 Encounter for follow-up examination after completed treatment for conditions other than malignant neoplasm: Secondary | ICD-10-CM | POA: Diagnosis not present

## 2020-06-27 DIAGNOSIS — Z712 Person consulting for explanation of examination or test findings: Secondary | ICD-10-CM | POA: Diagnosis not present

## 2020-06-27 DIAGNOSIS — J019 Acute sinusitis, unspecified: Secondary | ICD-10-CM | POA: Diagnosis not present

## 2020-06-27 DIAGNOSIS — K6289 Other specified diseases of anus and rectum: Secondary | ICD-10-CM | POA: Diagnosis not present

## 2020-06-27 DIAGNOSIS — M79644 Pain in right finger(s): Secondary | ICD-10-CM | POA: Diagnosis not present

## 2020-06-27 DIAGNOSIS — Z6830 Body mass index (BMI) 30.0-30.9, adult: Secondary | ICD-10-CM | POA: Diagnosis not present

## 2020-06-27 DIAGNOSIS — J1281 Pneumonia due to SARS-associated coronavirus: Secondary | ICD-10-CM | POA: Diagnosis not present

## 2020-06-27 DIAGNOSIS — H33313 Horseshoe tear of retina without detachment, bilateral: Secondary | ICD-10-CM | POA: Diagnosis not present

## 2020-06-27 DIAGNOSIS — H35413 Lattice degeneration of retina, bilateral: Secondary | ICD-10-CM | POA: Diagnosis not present

## 2020-06-27 DIAGNOSIS — U071 COVID-19: Secondary | ICD-10-CM | POA: Diagnosis not present

## 2020-06-27 DIAGNOSIS — L03011 Cellulitis of right finger: Secondary | ICD-10-CM | POA: Diagnosis not present

## 2020-06-27 DIAGNOSIS — Z0000 Encounter for general adult medical examination without abnormal findings: Secondary | ICD-10-CM | POA: Diagnosis not present

## 2020-06-27 DIAGNOSIS — Z4802 Encounter for removal of sutures: Secondary | ICD-10-CM | POA: Diagnosis not present

## 2020-06-27 DIAGNOSIS — J0100 Acute maxillary sinusitis, unspecified: Secondary | ICD-10-CM | POA: Diagnosis not present

## 2020-07-02 DIAGNOSIS — H33001 Unspecified retinal detachment with retinal break, right eye: Secondary | ICD-10-CM | POA: Diagnosis not present

## 2020-07-02 DIAGNOSIS — M545 Low back pain, unspecified: Secondary | ICD-10-CM | POA: Diagnosis not present

## 2020-07-02 DIAGNOSIS — J309 Allergic rhinitis, unspecified: Secondary | ICD-10-CM | POA: Diagnosis not present

## 2020-07-02 DIAGNOSIS — Z8616 Personal history of COVID-19: Secondary | ICD-10-CM | POA: Diagnosis not present

## 2020-07-02 DIAGNOSIS — R944 Abnormal results of kidney function studies: Secondary | ICD-10-CM | POA: Diagnosis not present

## 2020-07-02 DIAGNOSIS — M79671 Pain in right foot: Secondary | ICD-10-CM | POA: Diagnosis not present

## 2020-07-02 DIAGNOSIS — I10 Essential (primary) hypertension: Secondary | ICD-10-CM | POA: Diagnosis not present

## 2020-07-02 DIAGNOSIS — R7301 Impaired fasting glucose: Secondary | ICD-10-CM | POA: Diagnosis not present

## 2020-07-02 DIAGNOSIS — E039 Hypothyroidism, unspecified: Secondary | ICD-10-CM | POA: Diagnosis not present

## 2020-07-02 DIAGNOSIS — E782 Mixed hyperlipidemia: Secondary | ICD-10-CM | POA: Diagnosis not present

## 2020-07-02 DIAGNOSIS — N529 Male erectile dysfunction, unspecified: Secondary | ICD-10-CM | POA: Diagnosis not present

## 2020-09-19 DIAGNOSIS — H33313 Horseshoe tear of retina without detachment, bilateral: Secondary | ICD-10-CM | POA: Diagnosis not present

## 2020-09-19 DIAGNOSIS — H35413 Lattice degeneration of retina, bilateral: Secondary | ICD-10-CM | POA: Diagnosis not present

## 2020-11-06 DIAGNOSIS — R059 Cough, unspecified: Secondary | ICD-10-CM | POA: Diagnosis not present

## 2020-11-06 DIAGNOSIS — J111 Influenza due to unidentified influenza virus with other respiratory manifestations: Secondary | ICD-10-CM | POA: Diagnosis not present

## 2020-12-30 DIAGNOSIS — E782 Mixed hyperlipidemia: Secondary | ICD-10-CM | POA: Diagnosis not present

## 2020-12-30 DIAGNOSIS — E039 Hypothyroidism, unspecified: Secondary | ICD-10-CM | POA: Diagnosis not present

## 2020-12-30 DIAGNOSIS — I10 Essential (primary) hypertension: Secondary | ICD-10-CM | POA: Diagnosis not present

## 2020-12-30 DIAGNOSIS — R7301 Impaired fasting glucose: Secondary | ICD-10-CM | POA: Diagnosis not present

## 2021-01-05 DIAGNOSIS — I10 Essential (primary) hypertension: Secondary | ICD-10-CM | POA: Diagnosis not present

## 2021-01-05 DIAGNOSIS — E782 Mixed hyperlipidemia: Secondary | ICD-10-CM | POA: Diagnosis not present

## 2021-01-05 DIAGNOSIS — M79671 Pain in right foot: Secondary | ICD-10-CM | POA: Diagnosis not present

## 2021-01-05 DIAGNOSIS — J309 Allergic rhinitis, unspecified: Secondary | ICD-10-CM | POA: Diagnosis not present

## 2021-01-05 DIAGNOSIS — Z8249 Family history of ischemic heart disease and other diseases of the circulatory system: Secondary | ICD-10-CM | POA: Diagnosis not present

## 2021-01-05 DIAGNOSIS — R7303 Prediabetes: Secondary | ICD-10-CM | POA: Diagnosis not present

## 2021-01-05 DIAGNOSIS — R944 Abnormal results of kidney function studies: Secondary | ICD-10-CM | POA: Diagnosis not present

## 2021-01-05 DIAGNOSIS — Z0001 Encounter for general adult medical examination with abnormal findings: Secondary | ICD-10-CM | POA: Diagnosis not present

## 2021-01-05 DIAGNOSIS — E669 Obesity, unspecified: Secondary | ICD-10-CM | POA: Diagnosis not present

## 2021-01-05 DIAGNOSIS — E039 Hypothyroidism, unspecified: Secondary | ICD-10-CM | POA: Diagnosis not present

## 2021-01-05 DIAGNOSIS — F5221 Male erectile disorder: Secondary | ICD-10-CM | POA: Diagnosis not present

## 2021-01-05 DIAGNOSIS — M545 Low back pain, unspecified: Secondary | ICD-10-CM | POA: Diagnosis not present

## 2021-05-14 DIAGNOSIS — J019 Acute sinusitis, unspecified: Secondary | ICD-10-CM | POA: Diagnosis not present

## 2021-06-17 DIAGNOSIS — J069 Acute upper respiratory infection, unspecified: Secondary | ICD-10-CM | POA: Diagnosis not present

## 2021-06-17 DIAGNOSIS — U071 COVID-19: Secondary | ICD-10-CM | POA: Diagnosis not present

## 2021-07-07 DIAGNOSIS — E039 Hypothyroidism, unspecified: Secondary | ICD-10-CM | POA: Diagnosis not present

## 2021-07-07 DIAGNOSIS — R7303 Prediabetes: Secondary | ICD-10-CM | POA: Diagnosis not present

## 2021-07-07 DIAGNOSIS — E782 Mixed hyperlipidemia: Secondary | ICD-10-CM | POA: Diagnosis not present

## 2021-07-07 DIAGNOSIS — Z125 Encounter for screening for malignant neoplasm of prostate: Secondary | ICD-10-CM | POA: Diagnosis not present

## 2021-07-09 DIAGNOSIS — Z8249 Family history of ischemic heart disease and other diseases of the circulatory system: Secondary | ICD-10-CM | POA: Diagnosis not present

## 2021-07-09 DIAGNOSIS — J309 Allergic rhinitis, unspecified: Secondary | ICD-10-CM | POA: Diagnosis not present

## 2021-07-09 DIAGNOSIS — I10 Essential (primary) hypertension: Secondary | ICD-10-CM | POA: Diagnosis not present

## 2021-07-09 DIAGNOSIS — E669 Obesity, unspecified: Secondary | ICD-10-CM | POA: Diagnosis not present

## 2021-07-09 DIAGNOSIS — M79671 Pain in right foot: Secondary | ICD-10-CM | POA: Diagnosis not present

## 2021-07-09 DIAGNOSIS — F5221 Male erectile disorder: Secondary | ICD-10-CM | POA: Diagnosis not present

## 2021-07-09 DIAGNOSIS — R7303 Prediabetes: Secondary | ICD-10-CM | POA: Diagnosis not present

## 2021-07-09 DIAGNOSIS — R944 Abnormal results of kidney function studies: Secondary | ICD-10-CM | POA: Diagnosis not present

## 2021-07-09 DIAGNOSIS — H3323 Serous retinal detachment, bilateral: Secondary | ICD-10-CM | POA: Diagnosis not present

## 2021-07-09 DIAGNOSIS — E039 Hypothyroidism, unspecified: Secondary | ICD-10-CM | POA: Diagnosis not present

## 2021-07-09 DIAGNOSIS — E782 Mixed hyperlipidemia: Secondary | ICD-10-CM | POA: Diagnosis not present

## 2021-07-09 DIAGNOSIS — M545 Low back pain, unspecified: Secondary | ICD-10-CM | POA: Diagnosis not present

## 2021-08-24 DIAGNOSIS — E782 Mixed hyperlipidemia: Secondary | ICD-10-CM | POA: Diagnosis not present

## 2021-08-24 DIAGNOSIS — E291 Testicular hypofunction: Secondary | ICD-10-CM | POA: Diagnosis not present

## 2021-08-24 DIAGNOSIS — F5221 Male erectile disorder: Secondary | ICD-10-CM | POA: Diagnosis not present

## 2021-08-24 DIAGNOSIS — R944 Abnormal results of kidney function studies: Secondary | ICD-10-CM | POA: Diagnosis not present

## 2021-08-24 DIAGNOSIS — E039 Hypothyroidism, unspecified: Secondary | ICD-10-CM | POA: Diagnosis not present

## 2021-11-18 DIAGNOSIS — H04123 Dry eye syndrome of bilateral lacrimal glands: Secondary | ICD-10-CM | POA: Diagnosis not present

## 2021-12-03 DIAGNOSIS — J019 Acute sinusitis, unspecified: Secondary | ICD-10-CM | POA: Diagnosis not present

## 2021-12-23 DIAGNOSIS — J019 Acute sinusitis, unspecified: Secondary | ICD-10-CM | POA: Diagnosis not present

## 2021-12-31 DIAGNOSIS — E039 Hypothyroidism, unspecified: Secondary | ICD-10-CM | POA: Diagnosis not present

## 2021-12-31 DIAGNOSIS — R944 Abnormal results of kidney function studies: Secondary | ICD-10-CM | POA: Diagnosis not present

## 2021-12-31 DIAGNOSIS — R7301 Impaired fasting glucose: Secondary | ICD-10-CM | POA: Diagnosis not present

## 2021-12-31 DIAGNOSIS — E782 Mixed hyperlipidemia: Secondary | ICD-10-CM | POA: Diagnosis not present

## 2022-01-07 DIAGNOSIS — M545 Low back pain, unspecified: Secondary | ICD-10-CM | POA: Diagnosis not present

## 2022-01-07 DIAGNOSIS — R7303 Prediabetes: Secondary | ICD-10-CM | POA: Diagnosis not present

## 2022-01-07 DIAGNOSIS — J309 Allergic rhinitis, unspecified: Secondary | ICD-10-CM | POA: Diagnosis not present

## 2022-01-07 DIAGNOSIS — R944 Abnormal results of kidney function studies: Secondary | ICD-10-CM | POA: Diagnosis not present

## 2022-01-07 DIAGNOSIS — Z23 Encounter for immunization: Secondary | ICD-10-CM | POA: Diagnosis not present

## 2022-01-07 DIAGNOSIS — I10 Essential (primary) hypertension: Secondary | ICD-10-CM | POA: Diagnosis not present

## 2022-01-07 DIAGNOSIS — M79671 Pain in right foot: Secondary | ICD-10-CM | POA: Diagnosis not present

## 2022-01-07 DIAGNOSIS — Z8249 Family history of ischemic heart disease and other diseases of the circulatory system: Secondary | ICD-10-CM | POA: Diagnosis not present

## 2022-01-07 DIAGNOSIS — F5221 Male erectile disorder: Secondary | ICD-10-CM | POA: Diagnosis not present

## 2022-01-07 DIAGNOSIS — E039 Hypothyroidism, unspecified: Secondary | ICD-10-CM | POA: Diagnosis not present

## 2022-01-07 DIAGNOSIS — E782 Mixed hyperlipidemia: Secondary | ICD-10-CM | POA: Diagnosis not present

## 2022-01-07 DIAGNOSIS — Z0001 Encounter for general adult medical examination with abnormal findings: Secondary | ICD-10-CM | POA: Diagnosis not present

## 2022-03-09 IMAGING — DX DG CHEST 1V PORT
1 series · 1 of 1 positions shown · non-contrast
Comparison: None.

CLINICAL DATA: Shortness of breath COVID positive

EXAM:
PORTABLE CHEST 1 VIEW

[chest ap]
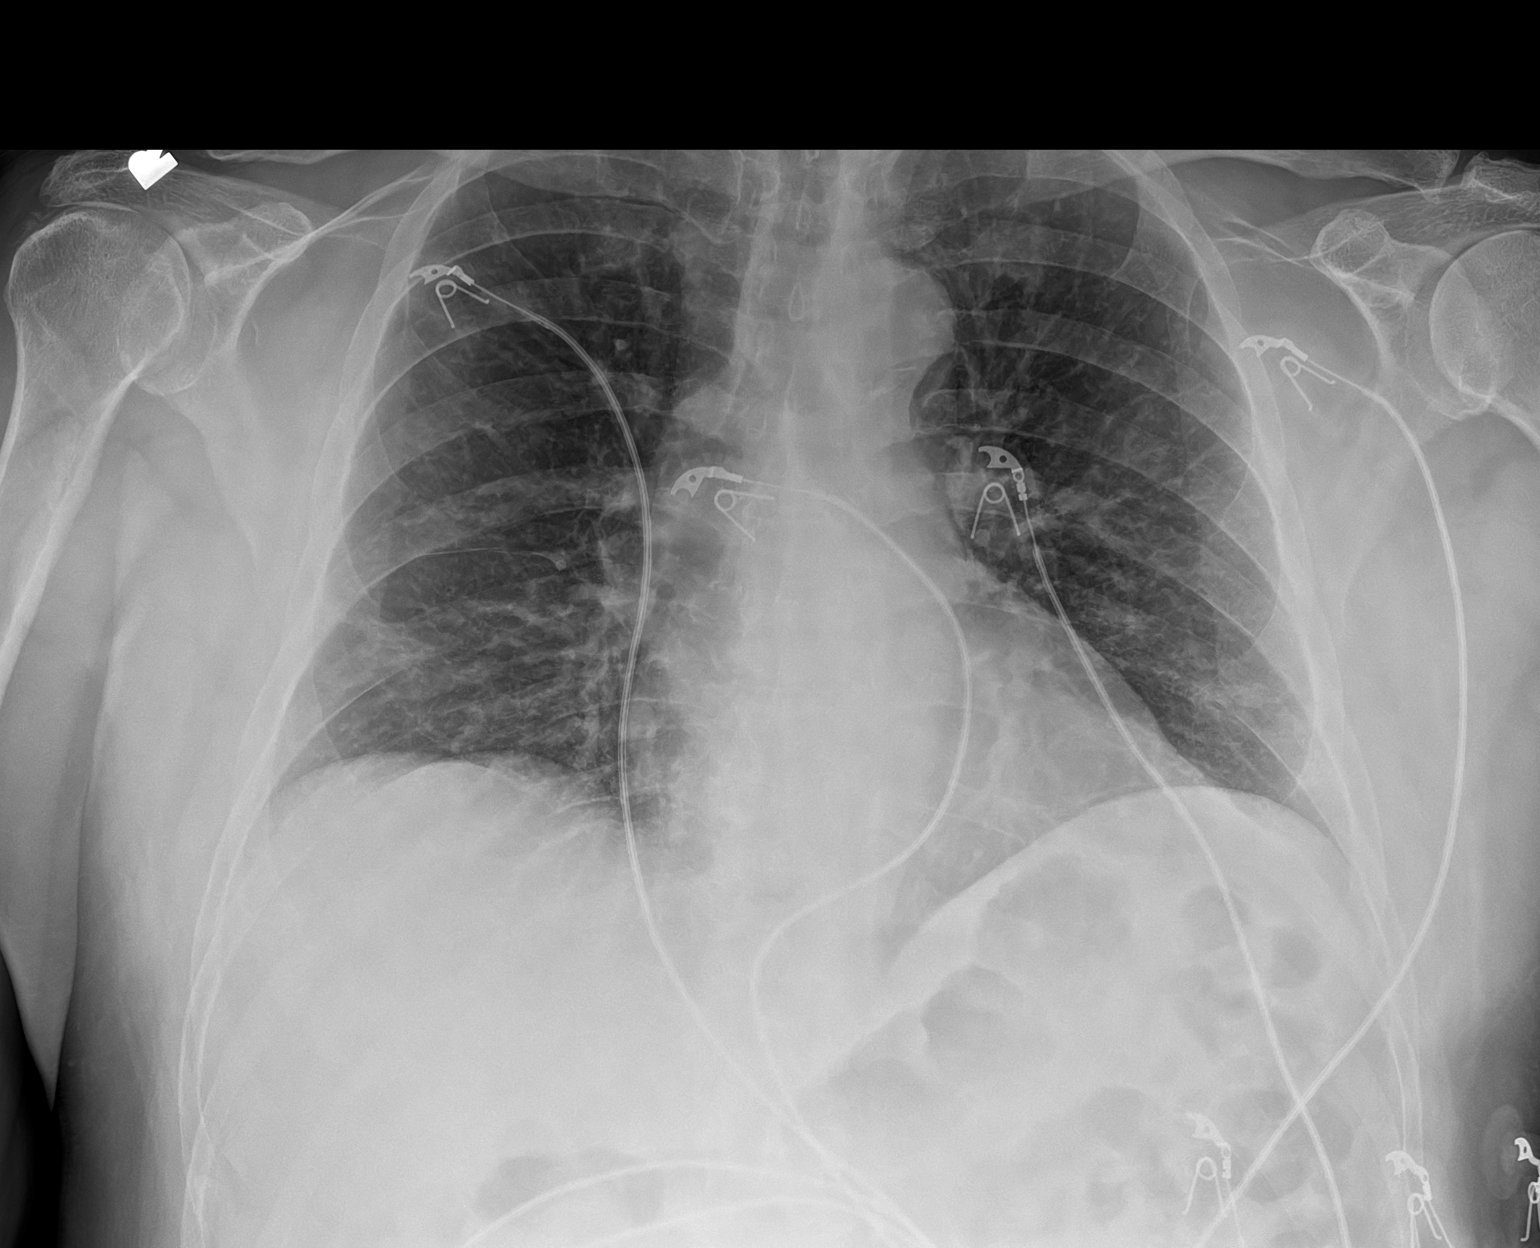

[1 of 1 positions shown; findings below may reference images not displayed]

FINDINGS: The heart size and mediastinal contours are within normal limits.
Hazy airspace opacity seen within both lower lungs. No pleural
effusion. No acute osseous abnormality.
IMPRESSION: Hazy airspace opacity in both lower lungs which is likely due to
multifocal infectious etiology.

## 2022-05-07 DIAGNOSIS — M25571 Pain in right ankle and joints of right foot: Secondary | ICD-10-CM | POA: Diagnosis not present

## 2022-05-07 DIAGNOSIS — M12571 Traumatic arthropathy, right ankle and foot: Secondary | ICD-10-CM | POA: Diagnosis not present

## 2022-07-05 DIAGNOSIS — E291 Testicular hypofunction: Secondary | ICD-10-CM | POA: Diagnosis not present

## 2022-07-05 DIAGNOSIS — R7303 Prediabetes: Secondary | ICD-10-CM | POA: Diagnosis not present

## 2022-07-05 DIAGNOSIS — E039 Hypothyroidism, unspecified: Secondary | ICD-10-CM | POA: Diagnosis not present

## 2022-07-05 DIAGNOSIS — E782 Mixed hyperlipidemia: Secondary | ICD-10-CM | POA: Diagnosis not present

## 2022-07-09 ENCOUNTER — Other Ambulatory Visit (HOSPITAL_COMMUNITY): Payer: Self-pay | Admitting: Internal Medicine

## 2022-07-09 DIAGNOSIS — E669 Obesity, unspecified: Secondary | ICD-10-CM | POA: Diagnosis not present

## 2022-07-09 DIAGNOSIS — M545 Low back pain, unspecified: Secondary | ICD-10-CM | POA: Diagnosis not present

## 2022-07-09 DIAGNOSIS — E039 Hypothyroidism, unspecified: Secondary | ICD-10-CM | POA: Diagnosis not present

## 2022-07-09 DIAGNOSIS — M79671 Pain in right foot: Secondary | ICD-10-CM | POA: Diagnosis not present

## 2022-07-09 DIAGNOSIS — R7303 Prediabetes: Secondary | ICD-10-CM | POA: Diagnosis not present

## 2022-07-09 DIAGNOSIS — R944 Abnormal results of kidney function studies: Secondary | ICD-10-CM | POA: Diagnosis not present

## 2022-07-09 DIAGNOSIS — Z8249 Family history of ischemic heart disease and other diseases of the circulatory system: Secondary | ICD-10-CM | POA: Diagnosis not present

## 2022-07-09 DIAGNOSIS — E782 Mixed hyperlipidemia: Secondary | ICD-10-CM | POA: Diagnosis not present

## 2022-07-09 DIAGNOSIS — Z0000 Encounter for general adult medical examination without abnormal findings: Secondary | ICD-10-CM | POA: Diagnosis not present

## 2022-07-09 DIAGNOSIS — J309 Allergic rhinitis, unspecified: Secondary | ICD-10-CM | POA: Diagnosis not present

## 2022-07-09 DIAGNOSIS — F5221 Male erectile disorder: Secondary | ICD-10-CM | POA: Diagnosis not present

## 2022-07-09 DIAGNOSIS — I10 Essential (primary) hypertension: Secondary | ICD-10-CM | POA: Diagnosis not present

## 2022-08-20 DIAGNOSIS — Z6832 Body mass index (BMI) 32.0-32.9, adult: Secondary | ICD-10-CM | POA: Diagnosis not present

## 2022-08-20 DIAGNOSIS — Z79890 Hormone replacement therapy (postmenopausal): Secondary | ICD-10-CM | POA: Diagnosis not present

## 2022-08-20 DIAGNOSIS — J309 Allergic rhinitis, unspecified: Secondary | ICD-10-CM | POA: Diagnosis not present

## 2022-08-20 DIAGNOSIS — Z713 Dietary counseling and surveillance: Secondary | ICD-10-CM | POA: Diagnosis not present

## 2022-08-20 DIAGNOSIS — Z7182 Exercise counseling: Secondary | ICD-10-CM | POA: Diagnosis not present

## 2022-08-20 DIAGNOSIS — Z79899 Other long term (current) drug therapy: Secondary | ICD-10-CM | POA: Diagnosis not present

## 2022-08-20 DIAGNOSIS — I10 Essential (primary) hypertension: Secondary | ICD-10-CM | POA: Diagnosis not present

## 2022-08-20 DIAGNOSIS — J019 Acute sinusitis, unspecified: Secondary | ICD-10-CM | POA: Diagnosis not present

## 2022-08-20 DIAGNOSIS — E039 Hypothyroidism, unspecified: Secondary | ICD-10-CM | POA: Diagnosis not present

## 2022-08-20 DIAGNOSIS — E669 Obesity, unspecified: Secondary | ICD-10-CM | POA: Diagnosis not present

## 2022-09-01 ENCOUNTER — Ambulatory Visit (HOSPITAL_COMMUNITY)
Admission: RE | Admit: 2022-09-01 | Discharge: 2022-09-01 | Disposition: A | Discharge status: Home / Self Care | Payer: PPO | Source: Ambulatory Visit | Attending: Internal Medicine | Admitting: Internal Medicine

## 2022-09-01 DIAGNOSIS — E782 Mixed hyperlipidemia: Secondary | ICD-10-CM | POA: Insufficient documentation

## 2022-09-11 ENCOUNTER — Ambulatory Visit
Admission: EM | Admit: 2022-09-11 | Discharge: 2022-09-11 | Disposition: A | Discharge status: Home / Self Care | Payer: PPO | Attending: Family Medicine | Admitting: Family Medicine

## 2022-09-11 DIAGNOSIS — R300 Dysuria: Secondary | ICD-10-CM

## 2022-09-11 LAB — POCT URINALYSIS DIP (MANUAL ENTRY)
Bilirubin, UA: NEGATIVE
Blood, UA: NEGATIVE
Glucose, UA: NEGATIVE mg/dL
Ketones, POC UA: NEGATIVE mg/dL
Leukocytes, UA: NEGATIVE
Nitrite, UA: NEGATIVE
Protein Ur, POC: NEGATIVE mg/dL
Spec Grav, UA: 1.025 (ref 1.010–1.025)
Urobilinogen, UA: 0.2 E.U./dL
pH, UA: 7 (ref 5.0–8.0)

## 2022-09-11 NOTE — ED Triage Notes (Signed)
Pt reports he has some stinging with urination and hard "get going"  x 1 day.

## 2022-09-13 NOTE — ED Provider Notes (Signed)
  [facility]    ASSESSMENT & PLAN:  1. Dysuria    Results for orders placed or performed during the hospital encounter of 09/11/22  POCT urinalysis dipstick  Result Value Ref Range   Color, UA yellow yellow   Clarity, UA clear clear   Glucose, UA negative negative mg/dL   Bilirubin, UA negative negative   Ketones, POC UA negative negative mg/dL   Spec Grav, UA 1.610 9.604 - 1.025   Blood, UA negative negative   pH, UA 7.0 5.0 - 8.0   Protein Ur, POC negative negative mg/dL   Urobilinogen, UA 0.2 0.2 or 1.0 E.U./dL   Nitrite, UA Negative Negative   Leukocytes, UA Negative Negative   U/A completely normal.  Comfortable with home observation and PCP f/u. Normal exam. VSS.  Outlined signs and symptoms indicating need for more acute intervention. Patient verbalized understanding. After Visit Summary given.  SUBJECTIVE:  [name] is a 67 y.o. male who complains of some stinging with urination and hard "get going"  x 1 day. Able to urinate here. Denies gross hematuria/abd pain/fever. Normal ambulation. No new medications.  OBJECTIVE:  Vitals:   09/11/22 1015  BP: 136/81  Pulse: 72  Resp: 20  Temp: 98.6 F (37 C)  TempSrc: Oral  SpO2: 96%   General appearance: alert; no distress HENT: oropharynx: moist Lungs: unlabored respirations Abdomen: soft, non-tender; bowel sounds normal; no masses or organomegaly; no guarding or rebound tenderness Back: no CVA tenderness Extremities: no edema; symmetrical with no gross deformities Skin: warm and dry Neurologic: normal gait Psychological: alert and cooperative; normal mood and affect  Labs Reviewed  POCT URINALYSIS DIP (MANUAL ENTRY)    Allergies  Allergen Reactions   Ceclor [Cefaclor] Itching    Past Medical History:  Diagnosis Date   Hypercholesteremia    Hypothyroidism    Social History   Socioeconomic History   Marital status: Married    Spouse name: Not on file   Number of children:  Not on file   Years of education: Not on file   Highest education level: Not on file  Occupational History   Not on file  Tobacco Use   Smoking status: Never   Smokeless tobacco: Never  Substance and Sexual Activity   Alcohol use: Yes    Alcohol/week: 0.0 standard drinks of alcohol   Drug use: No   Sexual activity: Not on file  Other Topics Concern   Not on file  Social History Narrative   Not on file   Social Determinants of Health   Financial Resource Strain: Not on file  Food Insecurity: Not on file  Transportation Needs: Not on file  Physical Activity: Not on file  Stress: Not on file  Social Connections: Not on file  Intimate Partner Violence: Not on file   Family History  Problem Relation Age of Onset   Heart disease Mother    Heart attack Mother    Heart disease Father    Heart attack Father    Heart attack Sister    Heart attack Sister         Mardella Layman, MD 09/13/22 [phone]

## 2022-12-13 DIAGNOSIS — R059 Cough, unspecified: Secondary | ICD-10-CM | POA: Diagnosis not present

## 2022-12-13 DIAGNOSIS — J019 Acute sinusitis, unspecified: Secondary | ICD-10-CM | POA: Diagnosis not present

## 2022-12-13 DIAGNOSIS — Z79899 Other long term (current) drug therapy: Secondary | ICD-10-CM | POA: Diagnosis not present

## 2022-12-13 DIAGNOSIS — Z20822 Contact with and (suspected) exposure to covid-19: Secondary | ICD-10-CM | POA: Diagnosis not present

## 2023-01-03 DIAGNOSIS — E782 Mixed hyperlipidemia: Secondary | ICD-10-CM | POA: Diagnosis not present

## 2023-01-03 DIAGNOSIS — E291 Testicular hypofunction: Secondary | ICD-10-CM | POA: Diagnosis not present

## 2023-01-03 DIAGNOSIS — R7303 Prediabetes: Secondary | ICD-10-CM | POA: Diagnosis not present

## 2023-01-03 DIAGNOSIS — E039 Hypothyroidism, unspecified: Secondary | ICD-10-CM | POA: Diagnosis not present

## 2023-01-12 DIAGNOSIS — E669 Obesity, unspecified: Secondary | ICD-10-CM | POA: Diagnosis not present

## 2023-01-12 DIAGNOSIS — E291 Testicular hypofunction: Secondary | ICD-10-CM | POA: Diagnosis not present

## 2023-01-12 DIAGNOSIS — F5221 Male erectile disorder: Secondary | ICD-10-CM | POA: Diagnosis not present

## 2023-01-12 DIAGNOSIS — R7303 Prediabetes: Secondary | ICD-10-CM | POA: Diagnosis not present

## 2023-01-12 DIAGNOSIS — E039 Hypothyroidism, unspecified: Secondary | ICD-10-CM | POA: Diagnosis not present

## 2023-01-12 DIAGNOSIS — I10 Essential (primary) hypertension: Secondary | ICD-10-CM | POA: Diagnosis not present

## 2023-01-12 DIAGNOSIS — M79671 Pain in right foot: Secondary | ICD-10-CM | POA: Diagnosis not present

## 2023-01-12 DIAGNOSIS — H3323 Serous retinal detachment, bilateral: Secondary | ICD-10-CM | POA: Diagnosis not present

## 2023-01-12 DIAGNOSIS — G8929 Other chronic pain: Secondary | ICD-10-CM | POA: Diagnosis not present

## 2023-01-12 DIAGNOSIS — J309 Allergic rhinitis, unspecified: Secondary | ICD-10-CM | POA: Diagnosis not present

## 2023-01-12 DIAGNOSIS — E782 Mixed hyperlipidemia: Secondary | ICD-10-CM | POA: Diagnosis not present

## 2023-01-12 DIAGNOSIS — R944 Abnormal results of kidney function studies: Secondary | ICD-10-CM | POA: Diagnosis not present

## 2023-01-24 DIAGNOSIS — G4733 Obstructive sleep apnea (adult) (pediatric): Secondary | ICD-10-CM | POA: Diagnosis not present

## 2023-02-24 DIAGNOSIS — G4733 Obstructive sleep apnea (adult) (pediatric): Secondary | ICD-10-CM | POA: Diagnosis not present

## 2023-06-13 DIAGNOSIS — G4733 Obstructive sleep apnea (adult) (pediatric): Secondary | ICD-10-CM | POA: Diagnosis not present

## 2023-07-07 DIAGNOSIS — E291 Testicular hypofunction: Secondary | ICD-10-CM | POA: Diagnosis not present

## 2023-07-07 DIAGNOSIS — E782 Mixed hyperlipidemia: Secondary | ICD-10-CM | POA: Diagnosis not present

## 2023-07-07 DIAGNOSIS — R7303 Prediabetes: Secondary | ICD-10-CM | POA: Diagnosis not present

## 2023-07-07 DIAGNOSIS — E039 Hypothyroidism, unspecified: Secondary | ICD-10-CM | POA: Diagnosis not present

## 2023-07-11 ENCOUNTER — Other Ambulatory Visit: Payer: Self-pay | Admitting: Otolaryngology

## 2023-07-14 DIAGNOSIS — I10 Essential (primary) hypertension: Secondary | ICD-10-CM | POA: Diagnosis not present

## 2023-07-14 DIAGNOSIS — R7303 Prediabetes: Secondary | ICD-10-CM | POA: Diagnosis not present

## 2023-07-14 DIAGNOSIS — M545 Low back pain, unspecified: Secondary | ICD-10-CM | POA: Diagnosis not present

## 2023-07-14 DIAGNOSIS — H3323 Serous retinal detachment, bilateral: Secondary | ICD-10-CM | POA: Diagnosis not present

## 2023-07-14 DIAGNOSIS — N529 Male erectile dysfunction, unspecified: Secondary | ICD-10-CM | POA: Diagnosis not present

## 2023-07-14 DIAGNOSIS — E039 Hypothyroidism, unspecified: Secondary | ICD-10-CM | POA: Diagnosis not present

## 2023-07-14 DIAGNOSIS — E291 Testicular hypofunction: Secondary | ICD-10-CM | POA: Diagnosis not present

## 2023-07-14 DIAGNOSIS — E782 Mixed hyperlipidemia: Secondary | ICD-10-CM | POA: Diagnosis not present

## 2023-07-14 DIAGNOSIS — J309 Allergic rhinitis, unspecified: Secondary | ICD-10-CM | POA: Diagnosis not present

## 2023-07-14 DIAGNOSIS — M79671 Pain in right foot: Secondary | ICD-10-CM | POA: Diagnosis not present

## 2023-07-14 DIAGNOSIS — R944 Abnormal results of kidney function studies: Secondary | ICD-10-CM | POA: Diagnosis not present

## 2023-07-14 DIAGNOSIS — J302 Other seasonal allergic rhinitis: Secondary | ICD-10-CM | POA: Diagnosis not present

## 2023-07-21 ENCOUNTER — Encounter (HOSPITAL_COMMUNITY)
Admission: RE | Disposition: A | Discharge status: Home / Self Care | Payer: Self-pay | Source: Home / Self Care | Attending: Otolaryngology

## 2023-07-21 ENCOUNTER — Other Ambulatory Visit: Payer: Self-pay

## 2023-07-21 ENCOUNTER — Ambulatory Visit (HOSPITAL_COMMUNITY): Admitting: Anesthesiology

## 2023-07-21 ENCOUNTER — Encounter (HOSPITAL_COMMUNITY): Payer: Self-pay | Admitting: Otolaryngology

## 2023-07-21 ENCOUNTER — Ambulatory Visit (HOSPITAL_COMMUNITY)
Admission: RE | Admit: 2023-07-21 | Discharge: 2023-07-21 | Disposition: A | Discharge status: Home / Self Care | Payer: PPO | Attending: Otolaryngology | Admitting: Otolaryngology

## 2023-07-21 DIAGNOSIS — G4733 Obstructive sleep apnea (adult) (pediatric): Secondary | ICD-10-CM | POA: Diagnosis not present

## 2023-07-21 DIAGNOSIS — I10 Essential (primary) hypertension: Secondary | ICD-10-CM | POA: Diagnosis not present

## 2023-07-21 DIAGNOSIS — E039 Hypothyroidism, unspecified: Secondary | ICD-10-CM | POA: Insufficient documentation

## 2023-07-21 HISTORY — PX: DRUG INDUCED ENDOSCOPY: SHX6808

## 2023-07-21 SURGERY — DRUG INDUCED SLEEP ENDOSCOPY
Anesthesia: Monitor Anesthesia Care | Laterality: Bilateral

## 2023-07-21 MED ORDER — PROPOFOL 10 MG/ML IV BOLUS
INTRAVENOUS | Status: DC | PRN
  Administered 2023-07-21: 30 mg via INTRAVENOUS

## 2023-07-21 MED ORDER — PROPOFOL 500 MG/50ML IV EMUL
INTRAVENOUS | Status: DC | PRN
  Administered 2023-07-21: 100 ug/kg/min via INTRAVENOUS

## 2023-07-21 MED ORDER — OXYMETAZOLINE HCL 0.05 % NA SOLN
NASAL | Status: DC | PRN
  Administered 2023-07-21: 1 via TOPICAL

## 2023-07-21 NOTE — Anesthesia Preprocedure Evaluation (Addendum)
 Anesthesia Evaluation  Patient identified by MRN, date of birth, ID band Patient awake    Reviewed: Allergy & Precautions, NPO status , Patient's Chart, lab work & pertinent test results  History of Anesthesia Complications Negative for: history of anesthetic complications  Airway Mallampati: II  TM Distance: >3 FB Neck ROM: Full    Dental  (+) Dental Advisory Given, Teeth Intact   Pulmonary sleep apnea    Pulmonary exam normal        Cardiovascular hypertension, Pt. on medications Normal cardiovascular exam     Neuro/Psych negative neurological ROS  negative psych ROS   GI/Hepatic negative GI ROS, Neg liver ROS,,,  Endo/Other  Hypothyroidism    Renal/GU negative Renal ROS     Musculoskeletal negative musculoskeletal ROS (+)    Abdominal   Peds  Hematology negative hematology ROS (+)   Anesthesia Other Findings   Reproductive/Obstetrics                             Anesthesia Physical Anesthesia Plan  ASA: 2  Anesthesia Plan: MAC   Post-op Pain Management: Minimal or no pain anticipated   Induction:   PONV Risk Score and Plan: 1 and Propofol infusion and Treatment may vary due to age or medical condition  Airway Management Planned: Nasal Cannula and Natural Airway  Additional Equipment: None  Intra-op Plan:   Post-operative Plan:   Informed Consent: I have reviewed the patients History and Physical, chart, labs and discussed the procedure including the risks, benefits and alternatives for the proposed anesthesia with the patient or authorized representative who has indicated his/her understanding and acceptance.       Plan Discussed with: CRNA and Anesthesiologist  Anesthesia Plan Comments:        Anesthesia Quick Evaluation

## 2023-07-21 NOTE — H&P (Signed)
 [name] is an 68 y.o. male.   Chief Complaint: Sleep apnea HPI: 68 year old male with sleep apnea who has not been able to tolerate CPAP.  Past Medical History:  Diagnosis Date   Hypercholesteremia    Hypothyroidism     Past Surgical History:  Procedure Laterality Date   ORTHOPEDIC SURGERY     ankle   OTHER SURGICAL HISTORY     sinus    Family History  Problem Relation Age of Onset   Heart disease Mother    Heart attack Mother    Heart disease Father    Heart attack Father    Heart attack Sister    Heart attack Sister    Social History:  reports that he has never smoked. He has never used smokeless tobacco. He reports current alcohol use. He reports that he does not use drugs.  Allergies:  Allergies  Allergen Reactions   Ceclor [Cefaclor] Itching    Medications Prior to Admission  Medication Sig Dispense Refill   Coenzyme Q10 (CO Q 10 PO) Take 1 capsule by mouth daily.      Cyanocobalamin (B-12 PO) Take 1 tablet by mouth daily.     levothyroxine (SYNTHROID, LEVOTHROID) 88 MCG tablet Take 1 tablet by mouth daily.     lisinopril (PRINIVIL,ZESTRIL) 5 MG tablet Take 1 tablet by mouth daily.     Magnesium Hydroxide (MAGNESIA PO) Take 1 tablet by mouth daily.     Multiple Vitamin (MULTIVITAMIN WITH MINERALS) TABS tablet Take 1 tablet by mouth daily.     Omega-3 Fatty Acids (FISH OIL) 1000 MG CAPS Take 1 capsule by mouth daily.     pravastatin (PRAVACHOL) 80 MG tablet Take 80 mg by mouth at bedtime.      vitamin C (ASCORBIC ACID) 500 MG tablet Take 500 mg by mouth daily.     zinc gluconate 50 MG tablet Take 50 mg by mouth daily.     fexofenadine (ALLEGRA) 180 MG tablet Take 180 mg by mouth daily.     PROAIR HFA 108 (90 Base) MCG/ACT inhaler Inhale 2 puffs into the lungs every 4 (four) hours as needed.      No results found for this or any previous visit (from the past 48 hours). No results found.  Review of Systems  All other systems reviewed and are  negative.   Blood pressure 121/83, pulse 63, temperature (!) 97.1 F (36.2 C), temperature source Temporal, resp. rate (!) 25, height 5\' 5"  (1.651 m), weight 86.2 kg, SpO2 100%. Physical Exam Constitutional:      Appearance: Normal appearance. He is normal weight.  HENT:     Head: Normocephalic and atraumatic.     Right Ear: External ear normal.     Left Ear: External ear normal.     Nose: Nose normal.     Mouth/Throat:     Mouth: Mucous membranes are moist.     Pharynx: Oropharynx is clear.  Eyes:     Extraocular Movements: Extraocular movements intact.     Pupils: Pupils are equal, round, and reactive to light.  Cardiovascular:     Rate and Rhythm: Normal rate.  Pulmonary:     Effort: Pulmonary effort is normal.  Skin:    General: Skin is warm and dry.  Neurological:     General: No focal deficit present.     Mental Status: He is alert and oriented to person, place, and time.  Psychiatric:  Mood and Affect: Mood normal.        Behavior: Behavior normal.        Thought Content: Thought content normal.        Judgment: Judgment normal.      Assessment/Plan Obstructive sleep apnea and BMI 31.62  To OR for sleep endoscopy.  Christia Reading, MD 07/21/2023, 2:29 PM

## 2023-07-21 NOTE — Transfer of Care (Signed)
 Immediate Anesthesia Transfer of Care Note  Patient: [name]  Procedure(s) Performed: DRUG INDUCED SLEEP ENDOSCOPY (Bilateral)  Patient Location: PACU  Anesthesia Type:MAC  Level of Consciousness: awake, alert , and oriented  Airway & Oxygen Therapy: Patient Spontanous Breathing and Patient connected to face mask oxygen  Post-op Assessment: Report given to RN and Post -op Vital signs reviewed and stable  Post vital signs: Reviewed and stable  Last Vitals:  Vitals Value Taken Time  BP 112/70 07/21/23 1447  Temp    Pulse 65 07/21/23 1448  Resp 14 07/21/23 1448  SpO2 98 % 07/21/23 1448  Vitals shown include unfiled device data.  Last Pain:  Vitals:   07/21/23 1447  TempSrc:   PainSc: 0-No pain         Complications: No notable events documented.

## 2023-07-21 NOTE — Anesthesia Postprocedure Evaluation (Signed)
 Anesthesia Post Note  Patient: [name]  Procedure(s) Performed: DRUG INDUCED SLEEP ENDOSCOPY (Bilateral)     Patient location during evaluation: PACU Anesthesia Type: MAC Level of consciousness: awake and alert Pain management: pain level controlled Vital Signs Assessment: post-procedure vital signs reviewed and stable Respiratory status: spontaneous breathing, nonlabored ventilation, respiratory function stable and patient connected to nasal cannula oxygen Cardiovascular status: stable and blood pressure returned to baseline Postop Assessment: no apparent nausea or vomiting Anesthetic complications: no  No notable events documented.  Last Vitals:  Vitals:   07/21/23 1500 07/21/23 1507  BP: 119/76   Pulse: (!) 59 (!) 59  Resp: (!) 23 20  Temp:    SpO2: 97% 97%    Last Pain:  Vitals:   07/21/23 1507  TempSrc:   PainSc: 0-No pain                 Juvencio Verdi L Avanell Banwart

## 2023-07-21 NOTE — Op Note (Signed)
 Preop diagnosis: Obstructive sleep apnea Postop diagnosis: same Procedure: Drug-induced sleep endoscopy Surgeon: Jenne Pane Anesth: IV sedation Compl: None Findings: There is 100% anterior-posterior collapse at the velum making him a candidate for hypoglossal nerve stimulator placement.  There was very little collapse at the tongue base. Description:  After discussing risks, benefits, and alternatives, the patient was brought to the operative suite and placed on the operative table in the supine position.  Anesthesia was induced and the patient was given light sedation to simulate natural sleep. When the proper level was reached, an Afrin-soaked pledget was placed in the right nasal passage for a couple of minutes and then removed.  The fiberoptic laryngoscope was then passed to view the pharynx and larynx.  Findings are noted above and the exam was recorded.  After completion, the scope was removed and the patient was returned to anesthesia for wakeup and was moved to the recovery room in stable condition.

## 2023-07-25 ENCOUNTER — Encounter (HOSPITAL_COMMUNITY): Payer: Self-pay | Admitting: Otolaryngology

## 2023-08-23 ENCOUNTER — Other Ambulatory Visit: Payer: Self-pay | Admitting: Otolaryngology

## 2023-09-05 DIAGNOSIS — G4733 Obstructive sleep apnea (adult) (pediatric): Secondary | ICD-10-CM | POA: Diagnosis not present

## 2023-09-07 NOTE — Pre-Procedure Instructions (Signed)
 Surgical Instructions   Your procedure is scheduled on Wednesday, May 7. Report to [facility] Main Entrance "A" at 9:15 A.M., then check in with the Admitting office. Any questions or running late day of surgery: call [phone]  Questions prior to your surgery date: call [phone], Monday-Friday, 8am-4pm. If you experience any cold or flu symptoms such as cough, fever, chills, shortness of breath, etc. between now and your scheduled surgery, please notify us  at the above number.     Remember:  Do not eat after midnight the night before your surgery   You may drink clear liquids until 8:15 the morning of your surgery.   Clear liquids allowed are: Water , Non-Citrus Juices (without pulp), Carbonated Beverages, Clear Tea (no milk, honey, etc.), Black Coffee Only (NO MILK, CREAM OR POWDERED CREAMER of any kind), and Gatorade.    Take these medicines the morning of surgery with A SIP OF WATER   amLODipine (NORVASC)  cycloSPORINE (RESTASIS)  fluticasone (FLONASE SENSIMIST)  levothyroxine (SYNTHROID)   May take these medicines IF NEEDED: azelastine (ASTELIN)  HYDROcodone -acetaminophen  (NORCO/VICODIN)  PROAIR  HFA 108 (90 Base) MCG/ACT inhaler    One week prior to surgery, STOP taking any Aspirin (unless otherwise instructed by your surgeon) Aleve, Naproxen, Ibuprofen, Motrin, Advil, Goody's, BC's, all herbal medications, fish oil, and non-prescription vitamins. This includes your meloxicam (MOBIC).                      Do NOT Smoke (Tobacco/Vaping) for 24 hours prior to your procedure.  If you use a CPAP at night, you may bring your mask/headgear for your overnight stay.   You will be asked to remove any contacts, glasses, piercing's, hearing aid's, dentures/partials prior to surgery. Please bring cases for these items if needed.    Patients discharged the day of surgery will not be allowed to drive home, and someone needs to stay with them for 24 hours.  SURGICAL WAITING ROOM  VISITATION Patients may have no more than 2 support people in the waiting area - these visitors may rotate.   Pre-op nurse will coordinate an appropriate time for 1 ADULT support person, who may not rotate, to accompany patient in pre-op.  Children under the age of 41 must have an adult with them who is not the patient and must remain in the main waiting area with an adult.  If the patient needs to stay at the hospital during part of their recovery, the visitor guidelines for inpatient rooms apply.  Please refer to the [facility] website for the visitor guidelines for any additional information.   If you received a COVID test during your pre-op visit  it is requested that you wear a mask when out in public, stay away from anyone that may not be feeling well and notify your surgeon if you develop symptoms. If you have been in contact with anyone that has tested positive in the last 10 days please notify you surgeon.      Pre-operative CHG Bathing Instructions   You can play a key role in reducing the risk of infection after surgery. Your skin needs to be as free of germs as possible. You can reduce the number of germs on your skin by washing with CHG (chlorhexidine gluconate) soap before surgery. CHG is an antiseptic soap that kills germs and continues to kill germs even after washing.   DO NOT use if you have an allergy to chlorhexidine/CHG or antibacterial soaps. If your skin becomes reddened  or irritated, stop using the CHG and notify one of our RNs at [phone].              TAKE A SHOWER THE NIGHT BEFORE SURGERY AND THE DAY OF SURGERY    Please keep in mind the following:  DO NOT shave, including legs and underarms, 48 hours prior to surgery.   You may shave your face before/day of surgery.  Place clean sheets on your bed the night before surgery Use a clean washcloth (not used since being washed) for each shower. DO NOT sleep with pet's night before surgery.  CHG Shower  Instructions:  Wash your face and private area with normal soap. If you choose to wash your hair, wash first with your normal shampoo.  After you use shampoo/soap, rinse your hair and body thoroughly to remove shampoo/soap residue.  Turn the water  OFF and apply half the bottle of CHG soap to a CLEAN washcloth.  Apply CHG soap ONLY FROM YOUR NECK DOWN TO YOUR TOES (washing for 3-5 minutes)  DO NOT use CHG soap on face, private areas, open wounds, or sores.  Pay special attention to the area where your surgery is being performed.  If you are having back surgery, having someone wash your back for you may be helpful. Wait 2 minutes after CHG soap is applied, then you may rinse off the CHG soap.  Pat dry with a clean towel  Put on clean pajamas    Additional instructions for the day of surgery: DO NOT APPLY any lotions, deodorants, cologne, or perfumes.   Do not wear jewelry or makeup Do not wear nail polish, gel polish, artificial nails, or any other type of covering on natural nails (fingers and toes) Do not bring valuables to the hospital. [facility] is not responsible for valuables/personal belongings. Put on clean/comfortable clothes.  Please brush your teeth.  Ask your nurse before applying any prescription medications to the skin.

## 2023-09-07 NOTE — Pre-Procedure Instructions (Signed)
 Surgical Instructions   Your procedure is scheduled on Wednesday, May 7th. Report to [facility] Main Entrance "A" at 9:15 A.M., then check in with the Admitting office. Any questions or running late day of surgery: call [phone]  Questions prior to your surgery date: call [phone], Monday-Friday, 8am-4pm. If you experience any cold or flu symptoms such as cough, fever, chills, shortness of breath, etc. between now and your scheduled surgery, please notify us  at the above number.     Remember:  Do not eat after midnight the night before your surgery   You may drink clear liquids until 8:15 the morning of your surgery.   Clear liquids allowed are: Water , Non-Citrus Juices (without pulp), Carbonated Beverages, Clear Tea (no milk, honey, etc.), Black Coffee Only (NO MILK, CREAM OR POWDERED CREAMER of any kind), and Gatorade.    Take these medicines the morning of surgery with A SIP OF WATER   amLODipine (NORVASC)  cycloSPORINE (RESTASIS)  fluticasone (FLONASE SENSIMIST)  levothyroxine (SYNTHROID)   May take these medicines IF NEEDED: azelastine (ASTELIN)  HYDROcodone -acetaminophen  (NORCO/VICODIN)  PROAIR  HFA 108 (90 Base) MCG/ACT inhaler    One week prior to surgery, STOP taking any Aspirin (unless otherwise instructed by your surgeon) Aleve, Naproxen, Ibuprofen, Motrin, Advil, Goody's, BC's, all herbal medications, fish oil, and non-prescription vitamins. This includes your meloxicam (MOBIC).                      Do NOT Smoke (Tobacco/Vaping) for 24 hours prior to your procedure.  If you use a CPAP at night, you may bring your mask/headgear for your overnight stay.   You will be asked to remove any contacts, glasses, piercing's, hearing aid's, dentures/partials prior to surgery. Please bring cases for these items if needed.    Patients discharged the day of surgery will not be allowed to drive home, and someone needs to stay with them for 24 hours.  SURGICAL WAITING ROOM  VISITATION Patients may have no more than 2 support people in the waiting area - these visitors may rotate.   Pre-op nurse will coordinate an appropriate time for 1 ADULT support person, who may not rotate, to accompany patient in pre-op.  Children under the age of 6 must have an adult with them who is not the patient and must remain in the main waiting area with an adult.  If the patient needs to stay at the hospital during part of their recovery, the visitor guidelines for inpatient rooms apply.  Please refer to the [facility] website for the visitor guidelines for any additional information.   If you received a COVID test during your pre-op visit  it is requested that you wear a mask when out in public, stay away from anyone that may not be feeling well and notify your surgeon if you develop symptoms. If you have been in contact with anyone that has tested positive in the last 10 days please notify you surgeon.      Pre-operative CHG Bathing Instructions   You can play a key role in reducing the risk of infection after surgery. Your skin needs to be as free of germs as possible. You can reduce the number of germs on your skin by washing with CHG (chlorhexidine gluconate) soap before surgery. CHG is an antiseptic soap that kills germs and continues to kill germs even after washing.   DO NOT use if you have an allergy to chlorhexidine/CHG or antibacterial soaps. If your skin becomes reddened  or irritated, stop using the CHG and notify one of our RNs at [phone].              TAKE A SHOWER THE NIGHT BEFORE SURGERY AND THE DAY OF SURGERY    Please keep in mind the following:  DO NOT shave, including legs and underarms, 48 hours prior to surgery.   You may shave your face before/day of surgery.  Place clean sheets on your bed the night before surgery Use a clean washcloth (not used since being washed) for each shower. DO NOT sleep with pet's night before surgery.  CHG Shower  Instructions:  Wash your face and private area with normal soap. If you choose to wash your hair, wash first with your normal shampoo.  After you use shampoo/soap, rinse your hair and body thoroughly to remove shampoo/soap residue.  Turn the water  OFF and apply half the bottle of CHG soap to a CLEAN washcloth.  Apply CHG soap ONLY FROM YOUR NECK DOWN TO YOUR TOES (washing for 3-5 minutes)  DO NOT use CHG soap on face, private areas, open wounds, or sores.  Pay special attention to the area where your surgery is being performed.  If you are having back surgery, having someone wash your back for you may be helpful. Wait 2 minutes after CHG soap is applied, then you may rinse off the CHG soap.  Pat dry with a clean towel  Put on clean pajamas    Additional instructions for the day of surgery: DO NOT APPLY any lotions, deodorants, cologne, or perfumes.   Do not wear jewelry or makeup Do not wear nail polish, gel polish, artificial nails, or any other type of covering on natural nails (fingers and toes) Do not bring valuables to the hospital. [facility] is not responsible for valuables/personal belongings. Put on clean/comfortable clothes.  Please brush your teeth.  Ask your nurse before applying any prescription medications to the skin.

## 2023-09-08 ENCOUNTER — Encounter (HOSPITAL_COMMUNITY): Payer: Self-pay

## 2023-09-08 ENCOUNTER — Encounter (HOSPITAL_COMMUNITY)
Admission: RE | Admit: 2023-09-08 | Discharge: 2023-09-08 | Disposition: A | Discharge status: Home / Self Care | Source: Ambulatory Visit | Attending: Otolaryngology | Admitting: Otolaryngology

## 2023-09-08 VITALS — BP 129/77 | HR 74 | Temp 98.3°F | Resp 18 | Ht 65.0 in | Wt 198.5 lb

## 2023-09-08 DIAGNOSIS — I10 Essential (primary) hypertension: Secondary | ICD-10-CM | POA: Diagnosis not present

## 2023-09-08 DIAGNOSIS — Z01818 Encounter for other preprocedural examination: Secondary | ICD-10-CM | POA: Insufficient documentation

## 2023-09-08 HISTORY — DX: Other specified postprocedural states: Z98.890

## 2023-09-08 HISTORY — DX: Essential (primary) hypertension: I10

## 2023-09-08 HISTORY — DX: Nausea with vomiting, unspecified: R11.2

## 2023-09-08 HISTORY — DX: Unspecified osteoarthritis, unspecified site: M19.90

## 2023-09-08 HISTORY — DX: Sleep apnea, unspecified: G47.30

## 2023-09-08 HISTORY — DX: Pneumonia, unspecified organism: J18.9

## 2023-09-08 HISTORY — DX: Unspecified asthma, uncomplicated: J45.909

## 2023-09-08 LAB — BASIC METABOLIC PANEL WITH GFR
Anion gap: 8 (ref 5–15)
BUN: 14 mg/dL (ref 8–23)
CO2: 25 mmol/L (ref 22–32)
Calcium: 9.2 mg/dL (ref 8.9–10.3)
Chloride: 106 mmol/L (ref 98–111)
Creatinine, Ser: 1.25 mg/dL — ABNORMAL HIGH (ref 0.61–1.24)
GFR, Estimated: >60 mL/min (ref >60)
Glucose, Bld: 107 mg/dL — ABNORMAL HIGH (ref 70–99)
Potassium: 4 mmol/L (ref 3.5–5.1)
Sodium: 139 mmol/L (ref 135–145)

## 2023-09-08 LAB — CBC
HCT: 41.1 % (ref 39.0–52.0)
Hemoglobin: 13.7 g/dL (ref 13.0–17.0)
MCH: 30.8 pg (ref 26.0–34.0)
MCHC: 33.3 g/dL (ref 30.0–36.0)
MCV: 92.4 fL (ref 80.0–100.0)
Platelets: 245 10*3/uL (ref 150–400)
RBC: 4.45 MIL/uL (ref 4.22–5.81)
RDW: 13.2 % (ref 11.5–15.5)
WBC: 7.7 10*3/uL (ref 4.0–10.5)
nRBC: 0 % (ref 0.0–0.2)

## 2023-09-08 NOTE — Progress Notes (Signed)
 PCP - Dr. Denman Fischer Cardiologist - Denies  PPM/ICD - Denies   Chest x-ray - 06/18/2020 EKG - 09/08/2023 Stress Test - 04/16/2011 ECHO - denies Cardiac Cath - denies CT Coronary - 08/2022  Sleep Study - + OSA unable to tolerate CPAP.  Fasting Blood Sugar - N/A Checks Blood Sugar _____ times a day N/A  Last dose of GLP1 agonist- N/A  GLP1 instructions: N/A  Blood Thinner Instructions:N/A Aspirin Instructions:N/A  ERAS Protcol - Clear liquids until 8:15 morning of surgery PRE-SURGERY Ensure or G2- N/A  COVID TEST- N/A   Anesthesia review: No  Patient denies shortness of breath, fever, cough and chest pain at PAT appointment   All instructions explained to the patient, with a verbal understanding of the material. Patient agrees to go over the instructions while at home for a better understanding. Patient also instructed to self quarantine after being tested for COVID-19. The opportunity to ask questions was provided.

## 2023-09-13 NOTE — Anesthesia Preprocedure Evaluation (Signed)
 Anesthesia Evaluation  Patient identified by MRN, date of birth, ID band Patient awake    Reviewed: Allergy & Precautions, NPO status , Patient's Chart, lab work & pertinent test results  History of Anesthesia Complications (+) PONV and history of anesthetic complications  Airway Mallampati: III  TM Distance: >3 FB Neck ROM: Full    Dental no notable dental hx. (+) Teeth Intact, Dental Advisory Given   Pulmonary asthma , sleep apnea    Pulmonary exam normal breath sounds clear to auscultation       Cardiovascular hypertension, Pt. on medications Normal cardiovascular exam Rhythm:Regular Rate:Normal     Neuro/Psych negative neurological ROS  negative psych ROS   GI/Hepatic negative GI ROS, Neg liver ROS,,,  Endo/Other  Hypothyroidism    Renal/GU negative Renal ROS  negative genitourinary   Musculoskeletal negative musculoskeletal ROS (+)    Abdominal   Peds  Hematology negative hematology ROS (+)   Anesthesia Other Findings   Reproductive/Obstetrics                             Anesthesia Physical Anesthesia Plan  ASA: 2  Anesthesia Plan: General   Post-op Pain Management: Tylenol  PO (pre-op)*   Induction: Intravenous  PONV Risk Score and Plan: 3 and Midazolam, Dexamethasone and Ondansetron  Airway Management Planned: Oral ETT  Additional Equipment:   Intra-op Plan:   Post-operative Plan: Extubation in OR  Informed Consent: I have reviewed the patients History and Physical, chart, labs and discussed the procedure including the risks, benefits and alternatives for the proposed anesthesia with the patient or authorized representative who has indicated his/her understanding and acceptance.     Dental advisory given  Plan Discussed with: CRNA  Anesthesia Plan Comments:        Anesthesia Quick Evaluation

## 2023-09-14 ENCOUNTER — Encounter (HOSPITAL_COMMUNITY)
Admission: RE | Disposition: A | Discharge status: Home / Self Care | Payer: Self-pay | Source: Home / Self Care | Attending: Otolaryngology

## 2023-09-14 ENCOUNTER — Other Ambulatory Visit: Payer: Self-pay

## 2023-09-14 ENCOUNTER — Ambulatory Visit (HOSPITAL_COMMUNITY): Payer: Self-pay | Admitting: Physician Assistant

## 2023-09-14 ENCOUNTER — Encounter (HOSPITAL_COMMUNITY): Payer: Self-pay | Admitting: Otolaryngology

## 2023-09-14 ENCOUNTER — Ambulatory Visit (HOSPITAL_COMMUNITY)
Admission: RE | Admit: 2023-09-14 | Discharge: 2023-09-14 | Disposition: A | Discharge status: Home / Self Care | Attending: Otolaryngology | Admitting: Otolaryngology

## 2023-09-14 ENCOUNTER — Ambulatory Visit (HOSPITAL_BASED_OUTPATIENT_CLINIC_OR_DEPARTMENT_OTHER): Payer: Self-pay

## 2023-09-14 ENCOUNTER — Ambulatory Visit (HOSPITAL_COMMUNITY)

## 2023-09-14 DIAGNOSIS — E039 Hypothyroidism, unspecified: Secondary | ICD-10-CM | POA: Insufficient documentation

## 2023-09-14 DIAGNOSIS — R0989 Other specified symptoms and signs involving the circulatory and respiratory systems: Secondary | ICD-10-CM | POA: Diagnosis not present

## 2023-09-14 DIAGNOSIS — Z6831 Body mass index (BMI) 31.0-31.9, adult: Secondary | ICD-10-CM | POA: Diagnosis not present

## 2023-09-14 DIAGNOSIS — G4733 Obstructive sleep apnea (adult) (pediatric): Secondary | ICD-10-CM | POA: Diagnosis not present

## 2023-09-14 DIAGNOSIS — I10 Essential (primary) hypertension: Secondary | ICD-10-CM

## 2023-09-14 DIAGNOSIS — J9811 Atelectasis: Secondary | ICD-10-CM | POA: Diagnosis not present

## 2023-09-14 DIAGNOSIS — J45909 Unspecified asthma, uncomplicated: Secondary | ICD-10-CM | POA: Insufficient documentation

## 2023-09-14 DIAGNOSIS — Z79890 Hormone replacement therapy (postmenopausal): Secondary | ICD-10-CM | POA: Diagnosis not present

## 2023-09-14 DIAGNOSIS — Z79899 Other long term (current) drug therapy: Secondary | ICD-10-CM | POA: Insufficient documentation

## 2023-09-14 DIAGNOSIS — Z6832 Body mass index (BMI) 32.0-32.9, adult: Secondary | ICD-10-CM | POA: Diagnosis not present

## 2023-09-14 HISTORY — PX: IMPLANTATION OF HYPOGLOSSAL NERVE STIMULATOR: SHX6827

## 2023-09-14 SURGERY — INSERTION, HYPOGLOSSAL NERVE STIMULATOR
Anesthesia: General | Laterality: Bilateral

## 2023-09-14 MED ORDER — PROPOFOL 10 MG/ML IV BOLUS
INTRAVENOUS | Status: DC | PRN
  Administered 2023-09-14: 100 ug/kg/min via INTRAVENOUS
  Administered 2023-09-14: 150 mg via INTRAVENOUS
  Administered 2023-09-14: 30 mg via INTRAVENOUS

## 2023-09-14 MED ORDER — FENTANYL CITRATE (PF) 100 MCG/2ML IJ SOLN: 25.0000 ug | INTRAMUSCULAR | Status: DC | PRN

## 2023-09-14 MED ORDER — MIDAZOLAM HCL 2 MG/2ML IJ SOLN
INTRAMUSCULAR | Status: DC | PRN
  Administered 2023-09-14: 2 mg via INTRAVENOUS

## 2023-09-14 MED ORDER — ONDANSETRON HCL 4 MG/2ML IJ SOLN
INTRAMUSCULAR | Status: AC
  Filled 2023-09-14: qty 2

## 2023-09-14 MED ORDER — FENTANYL CITRATE (PF) 250 MCG/5ML IJ SOLN
INTRAMUSCULAR | Status: DC | PRN
  Administered 2023-09-14 (×4): 50 ug via INTRAVENOUS

## 2023-09-14 MED ORDER — LIDOCAINE 2% (20 MG/ML) 5 ML SYRINGE
INTRAMUSCULAR | Status: AC
  Filled 2023-09-14: qty 5

## 2023-09-14 MED ORDER — DEXAMETHASONE SODIUM PHOSPHATE 10 MG/ML IJ SOLN
INTRAMUSCULAR | Status: DC | PRN
  Administered 2023-09-14: 10 mg via INTRAVENOUS

## 2023-09-14 MED ORDER — PHENYLEPHRINE 80 MCG/ML (10ML) SYRINGE FOR IV PUSH (FOR BLOOD PRESSURE SUPPORT)
PREFILLED_SYRINGE | INTRAVENOUS | Status: DC | PRN
  Administered 2023-09-14: 240 ug via INTRAVENOUS
  Administered 2023-09-14: 80 ug via INTRAVENOUS

## 2023-09-14 MED ORDER — SUCCINYLCHOLINE CHLORIDE 200 MG/10ML IV SOSY
PREFILLED_SYRINGE | INTRAVENOUS | Status: DC | PRN
  Administered 2023-09-14: 200 mg via INTRAVENOUS

## 2023-09-14 MED ORDER — CHLORHEXIDINE GLUCONATE 0.12 % MT SOLN
OROMUCOSAL | Status: AC
  Administered 2023-09-14: 15 mL via OROMUCOSAL
  Filled 2023-09-14: qty 15

## 2023-09-14 MED ORDER — OXYCODONE HCL 5 MG/5ML PO SOLN: 5.0000 mg | Freq: Once | ORAL | Status: DC | PRN

## 2023-09-14 MED ORDER — ORAL CARE MOUTH RINSE: 15.0000 mL | Freq: Once | OROMUCOSAL | Status: AC

## 2023-09-14 MED ORDER — AMISULPRIDE (ANTIEMETIC) 5 MG/2ML IV SOLN: 10.0000 mg | Freq: Once | INTRAVENOUS | Status: DC | PRN

## 2023-09-14 MED ORDER — ROCURONIUM BROMIDE 10 MG/ML (PF) SYRINGE
PREFILLED_SYRINGE | INTRAVENOUS | Status: DC | PRN
  Administered 2023-09-14: 5 mg via INTRAVENOUS

## 2023-09-14 MED ORDER — LIDOCAINE 2% (20 MG/ML) 5 ML SYRINGE
INTRAMUSCULAR | Status: DC | PRN
Start: 2023-09-14 — End: 2023-09-14
  Administered 2023-09-14: 80 mg via INTRAVENOUS

## 2023-09-14 MED ORDER — OXYCODONE HCL 5 MG PO TABS: 5.0000 mg | ORAL_TABLET | Freq: Once | ORAL | Status: DC | PRN

## 2023-09-14 MED ORDER — ACETAMINOPHEN 500 MG PO TABS: 1000.0000 mg | ORAL_TABLET | Freq: Once | ORAL | Status: AC

## 2023-09-14 MED ORDER — VANCOMYCIN HCL IN DEXTROSE 1-5 GM/200ML-% IV SOLN
INTRAVENOUS | Status: AC
Start: 2023-09-14 — End: 2023-09-14
  Administered 2023-09-14: 1000 mg via INTRAVENOUS
  Filled 2023-09-14: qty 200

## 2023-09-14 MED ORDER — LIDOCAINE-EPINEPHRINE 1 %-1:100000 IJ SOLN
INTRAMUSCULAR | Status: DC | PRN
  Administered 2023-09-14: 4 mL

## 2023-09-14 MED ORDER — CHLORHEXIDINE GLUCONATE 0.12 % MT SOLN: 15.0000 mL | Freq: Once | OROMUCOSAL | Status: AC

## 2023-09-14 MED ORDER — ONDANSETRON HCL 4 MG/2ML IJ SOLN
INTRAMUSCULAR | Status: DC | PRN
  Administered 2023-09-14: 4 mg via INTRAVENOUS

## 2023-09-14 MED ORDER — PROPOFOL 10 MG/ML IV BOLUS
INTRAVENOUS | Status: AC
  Filled 2023-09-14: qty 20

## 2023-09-14 MED ORDER — ACETAMINOPHEN 500 MG PO TABS
ORAL_TABLET | ORAL | Status: AC
  Administered 2023-09-14: 1000 mg via ORAL
  Filled 2023-09-14: qty 2

## 2023-09-14 MED ORDER — VANCOMYCIN HCL IN DEXTROSE 1-5 GM/200ML-% IV SOLN
1000.0000 mg | INTRAVENOUS | Status: AC
Start: 2023-09-14 — End: 2023-09-14

## 2023-09-14 MED ORDER — LACTATED RINGERS IV SOLN: INTRAVENOUS | Status: DC

## 2023-09-14 MED ORDER — DEXAMETHASONE SODIUM PHOSPHATE 10 MG/ML IJ SOLN
INTRAMUSCULAR | Status: AC
  Filled 2023-09-14: qty 1

## 2023-09-14 MED ORDER — FENTANYL CITRATE (PF) 250 MCG/5ML IJ SOLN
INTRAMUSCULAR | Status: AC
  Filled 2023-09-14: qty 5

## 2023-09-14 MED ORDER — EPHEDRINE SULFATE-NACL 50-0.9 MG/10ML-% IV SOSY
PREFILLED_SYRINGE | INTRAVENOUS | Status: DC | PRN
  Administered 2023-09-14: 10 mg via INTRAVENOUS

## 2023-09-14 MED ORDER — MIDAZOLAM HCL 2 MG/2ML IJ SOLN
INTRAMUSCULAR | Status: AC
  Filled 2023-09-14: qty 2

## 2023-09-14 MED ORDER — PHENYLEPHRINE HCL-NACL 20-0.9 MG/250ML-% IV SOLN
INTRAVENOUS | Status: DC | PRN
  Administered 2023-09-14: 30 ug/min via INTRAVENOUS

## 2023-09-14 SURGICAL SUPPLY — 55 items
BAG COUNTER SPONGE SURGICOUNT (BAG) ×2 IMPLANT
BLADE CLIPPER SURG (BLADE) IMPLANT
BLADE SURG 15 STRL LF DISP TIS (BLADE) ×2 IMPLANT
CORD BIPOLAR FORCEPS 12FT (ELECTRODE) ×2 IMPLANT
COVER PROBE W GEL 5X96 (DRAPES) ×2 IMPLANT
COVER SURGICAL LIGHT HANDLE (MISCELLANEOUS) ×2 IMPLANT
DERMABOND ADVANCED .7 DNX12 (GAUZE/BANDAGES/DRESSINGS) ×2 IMPLANT
DRAPE C-ARM 35X43 STRL (DRAPES) ×2 IMPLANT
DRAPE HEAD BAR (DRAPES) ×2 IMPLANT
DRAPE INCISE IOBAN 66X45 STRL (DRAPES) ×2 IMPLANT
DRAPE MICROSCOPE LEICA 54X105 (DRAPES) ×2 IMPLANT
DRAPE UTILITY XL STRL (DRAPES) ×2 IMPLANT
DRSG TEGADERM 4X4.75 (GAUZE/BANDAGES/DRESSINGS) ×6 IMPLANT
ELECT COATED BLADE 2.86 ST (ELECTRODE) ×2 IMPLANT
ELECTRODE EMG 18 NIMS (NEUROSURGERY SUPPLIES) ×2 IMPLANT
ELECTRODE REM PT RTRN 9FT ADLT (ELECTROSURGICAL) ×2 IMPLANT
FORCEPS BIPOLAR SPETZLER 8 1.0 (NEUROSURGERY SUPPLIES) ×2 IMPLANT
GAUZE 4X4 16PLY ~~LOC~~+RFID DBL (SPONGE) ×2 IMPLANT
GAUZE SPONGE 4X4 12PLY STRL (GAUZE/BANDAGES/DRESSINGS) ×2 IMPLANT
GENERATOR PULSE INSPIRE (Generator) ×1 IMPLANT
GENERATOR PULSE INSPIRE IV (Generator) ×2 IMPLANT
GLOVE BIO SURGEON STRL SZ7.5 (GLOVE) ×2 IMPLANT
GOWN STRL REUS W/ TWL LRG LVL3 (GOWN DISPOSABLE) ×6 IMPLANT
KIT BASIN OR (CUSTOM PROCEDURE TRAY) ×2 IMPLANT
KIT NEURO ACCESSORY W/WRENCH (MISCELLANEOUS) IMPLANT
KIT TURNOVER KIT B (KITS) ×2 IMPLANT
LEAD SENSING RESP INSPIRE (Lead) ×1 IMPLANT
LEAD SENSING RESP INSPIRE IV (Lead) ×2 IMPLANT
LEAD SLEEP STIM INSPIRE IV/V (Lead) ×2 IMPLANT
LEAD SLEEP STIMULATION INSPIRE (Lead) ×1 IMPLANT
LOOP VASCLR MAXI BLUE 18IN ST (MISCELLANEOUS) ×2 IMPLANT
LOOPS VASCLR MAXI BLUE 18IN ST (MISCELLANEOUS) ×1 IMPLANT
MARKER SKIN DUAL TIP RULER LAB (MISCELLANEOUS) ×4 IMPLANT
NDL HYPO 25GX1X1/2 BEV (NEEDLE) ×2 IMPLANT
NEEDLE HYPO 25GX1X1/2 BEV (NEEDLE) ×1 IMPLANT
NS IRRIG 1000ML POUR BTL (IV SOLUTION) ×2 IMPLANT
PAD ARMBOARD POSITIONER FOAM (MISCELLANEOUS) ×2 IMPLANT
PASSER CATH 38CM DISP (INSTRUMENTS) ×2 IMPLANT
PENCIL SMOKE EVACUATOR (MISCELLANEOUS) ×2 IMPLANT
POSITIONER HEAD DONUT 9IN (MISCELLANEOUS) ×2 IMPLANT
PROBE NERVE STIMULATOR (NEUROSURGERY SUPPLIES) ×2 IMPLANT
REMOTE CONTROL SLEEP INSPIRE (MISCELLANEOUS) ×2 IMPLANT
SET WALTER ACTIVATION W/DRAPE (SET/KITS/TRAYS/PACK) ×2 IMPLANT
SPONGE INTESTINAL PEANUT (DISPOSABLE) ×2 IMPLANT
SUT SILK 2 0 SH (SUTURE) ×2 IMPLANT
SUT SILK 3 0 REEL (SUTURE) ×2 IMPLANT
SUT SILK 3 0 SH 30 (SUTURE) ×4 IMPLANT
SUT VIC AB 3-0 SH 27X BRD (SUTURE) ×4 IMPLANT
SUT VIC AB 4-0 PS2 27 (SUTURE) ×4 IMPLANT
SUTURE SILK 3-0 RB1 30XBRD (SUTURE) ×2 IMPLANT
SYR 10ML LL (SYRINGE) ×2 IMPLANT
TAPE CLOTH SURG 4X10 WHT LF (GAUZE/BANDAGES/DRESSINGS) ×2 IMPLANT
TOWEL GREEN STERILE (TOWEL DISPOSABLE) ×2 IMPLANT
TRAY ENT MC OR (CUSTOM PROCEDURE TRAY) ×2 IMPLANT
VASCULAR TIE MINI RED 18IN STL (MISCELLANEOUS) ×2 IMPLANT

## 2023-09-14 NOTE — Anesthesia Procedure Notes (Signed)
 Procedure Name: Intubation Date/Time: 09/14/2023 11:19 AM  Performed by: Ancel Baltimore, RNPre-anesthesia Checklist: Patient identified, Emergency Drugs available, Suction available and Patient being monitored Patient Re-evaluated:Patient Re-evaluated prior to induction Oxygen Delivery Method: Circle system utilized Preoxygenation: Pre-oxygenation with 100% oxygen Induction Type: IV induction Ventilation: Mask ventilation without difficulty Laryngoscope Size: Mac and 4 Grade View: Grade I Tube type: Oral Tube size: 7.5 mm Number of attempts: 1 Airway Equipment and Method: Stylet Placement Confirmation: ETT inserted through vocal cords under direct vision, positive ETCO2 and breath sounds checked- equal and bilateral Secured at: 23 cm Tube secured with: Tape Dental Injury: Teeth and Oropharynx as per pre-operative assessment  Comments: Preformed by SRNA, MDA and CRNA present for procedure

## 2023-09-14 NOTE — Transfer of Care (Signed)
 Immediate Anesthesia Transfer of Care Note  Patient: [name]  Procedure(s) Performed: INSERTION, HYPOGLOSSAL NERVE STIMULATOR (Bilateral)  Patient Location: PACU  Anesthesia Type:General  Level of Consciousness: drowsy  Airway & Oxygen Therapy: Patient Spontanous Breathing and Patient connected to face mask oxygen  Post-op Assessment: Report given to RN and Post -op Vital signs reviewed and stable  Post vital signs: Reviewed and stable  Last Vitals:  Vitals Value Taken Time  BP 112/54 09/14/23 1251  Temp    Pulse 66 09/14/23 1254  Resp 17 09/14/23 1254  SpO2 96 % 09/14/23 1254  Vitals shown include unfiled device data.  Last Pain:  Vitals:   09/14/23 0935  TempSrc:   PainSc: 0-No pain         Complications: No notable events documented.

## 2023-09-14 NOTE — Op Note (Signed)

## 2023-09-14 NOTE — Anesthesia Postprocedure Evaluation (Signed)
 Anesthesia Post Note  Patient: [name]  Procedure(s) Performed: INSERTION, HYPOGLOSSAL NERVE STIMULATOR (Bilateral)     Patient location during evaluation: PACU Anesthesia Type: General Level of consciousness: awake and alert Pain management: pain level controlled Vital Signs Assessment: post-procedure vital signs reviewed and stable Respiratory status: spontaneous breathing, nonlabored ventilation, respiratory function stable and patient connected to nasal cannula oxygen Cardiovascular status: blood pressure returned to baseline and stable Postop Assessment: no apparent nausea or vomiting Anesthetic complications: no  No notable events documented.  Last Vitals:  Vitals:   09/14/23 1345 09/14/23 1400  BP: 120/61 123/64  Pulse: 67 68  Resp: (!) 22 (!) 22  Temp:  36.7 C  SpO2: 95% 93%    Last Pain:  Vitals:   09/14/23 1400  TempSrc:   PainSc: 0-No pain                 Alphus Zeck L Jasiah Elsen

## 2023-09-14 NOTE — H&P (Signed)
 [name] is an 68 y.o. male.   Chief Complaint: Sleep apnea HPI: 68 year old male with sleep apnea who has been unable to tolerate CPAP.  Past Medical History:  Diagnosis Date   Arthritis    in right foot   Asthma    allergy induced   Hypercholesteremia    Hypertension    Hypothyroidism    Pneumonia    double pneumonia when diagnosed with COVID per patient   PONV (postoperative nausea and vomiting)    No issues with his most recent surgery (March 2025)   Sleep apnea     Past Surgical History:  Procedure Laterality Date   DRUG INDUCED ENDOSCOPY Bilateral 07/21/2023   Procedure: DRUG INDUCED SLEEP ENDOSCOPY;  Surgeon: Virgina Grills, MD;  Location: [facility] ENDOSCOPY;  Service: ENT;  Laterality: Bilateral;   ORTHOPEDIC SURGERY     ankle   ORTHOPEDIC SURGERY     shoulder   OTHER SURGICAL HISTORY     sinus    Family History  Problem Relation Age of Onset   Heart disease Mother    Heart attack Mother    Heart disease Father    Heart attack Father    Heart attack Sister    Heart attack Sister    Social History:  reports that he has never smoked. He has never used smokeless tobacco. He reports current alcohol use. He reports that he does not use drugs.  Allergies:  Allergies  Allergen Reactions   Ceclor [Cefaclor] Itching    Medications Prior to Admission  Medication Sig Dispense Refill   amLODipine (NORVASC) 2.5 MG tablet Take 2.5 mg by mouth daily.     cycloSPORINE (RESTASIS) 0.05 % ophthalmic emulsion Place 1 drop into both eyes 2 (two) times daily.     fluticasone (FLONASE SENSIMIST) 27.5 MCG/SPRAY nasal spray Place 2 sprays into the nose daily.     levocetirizine (XYZAL) 5 MG tablet Take 5 mg by mouth every evening.     levothyroxine (SYNTHROID) 75 MCG tablet Take 75 mcg by mouth daily before breakfast.     meloxicam (MOBIC) 7.5 MG tablet Take 7.5 mg by mouth daily as needed for pain.     montelukast (SINGULAIR) 10 MG tablet Take 10 mg by mouth daily as needed  (For allergies).     rosuvastatin (CRESTOR) 20 MG tablet Take 20 mg by mouth at bedtime.     azelastine (ASTELIN) 0.1 % nasal spray Place 1 spray into both nostrils 2 (two) times daily as needed for rhinitis or allergies. Use in each nostril as directed     HYDROcodone -acetaminophen  (NORCO/VICODIN) 5-325 MG tablet Take 1 tablet by mouth every 4 (four) hours as needed for severe pain (pain score 7-10).     PROAIR  HFA 108 (90 Base) MCG/ACT inhaler Inhale 2 puffs into the lungs every 4 (four) hours as needed for wheezing or shortness of breath.      No results found for this or any previous visit (from the past 48 hours). No results found.  Review of Systems  All other systems reviewed and are negative.   Blood pressure 128/78, pulse 60, temperature 98.2 F (36.8 C), temperature source Oral, resp. rate 17, height 5\' 5"  (1.651 m), weight 85.7 kg, SpO2 97%. Physical Exam Constitutional:      Appearance: Normal appearance.  HENT:     Head: Normocephalic and atraumatic.     Right Ear: External ear normal.     Left Ear: External ear normal.  Nose: Nose normal.     Mouth/Throat:     Mouth: Mucous membranes are moist.     Pharynx: Oropharynx is clear.  Eyes:     Extraocular Movements: Extraocular movements intact.     Pupils: Pupils are equal, round, and reactive to light.  Cardiovascular:     Rate and Rhythm: Normal rate.  Pulmonary:     Effort: Pulmonary effort is normal.  Skin:    General: Skin is warm and dry.  Neurological:     General: No focal deficit present.     Mental Status: He is alert and oriented to person, place, and time.  Psychiatric:        Mood and Affect: Mood normal.        Behavior: Behavior normal.        Thought Content: Thought content normal.        Judgment: Judgment normal.      Assessment/Plan Obstructive sleep apnea and BMI 31.45  To OR for hypoglossal nerve stimulator placement.  Virgina Grills, MD 09/14/2023, 10:24 AM

## 2023-09-15 ENCOUNTER — Encounter (HOSPITAL_COMMUNITY): Payer: Self-pay | Admitting: Otolaryngology

## 2023-11-02 DIAGNOSIS — Z4542 Encounter for adjustment and management of neuropacemaker (brain) (peripheral nerve) (spinal cord): Secondary | ICD-10-CM | POA: Diagnosis not present

## 2023-11-02 DIAGNOSIS — G4733 Obstructive sleep apnea (adult) (pediatric): Secondary | ICD-10-CM | POA: Diagnosis not present

## 2023-12-19 DIAGNOSIS — Z4542 Encounter for adjustment and management of neuropacemaker (brain) (peripheral nerve) (spinal cord): Secondary | ICD-10-CM | POA: Diagnosis not present

## 2023-12-19 DIAGNOSIS — G4733 Obstructive sleep apnea (adult) (pediatric): Secondary | ICD-10-CM | POA: Diagnosis not present

## 2023-12-28 DIAGNOSIS — M12571 Traumatic arthropathy, right ankle and foot: Secondary | ICD-10-CM | POA: Diagnosis not present

## 2024-01-17 DIAGNOSIS — Z125 Encounter for screening for malignant neoplasm of prostate: Secondary | ICD-10-CM | POA: Diagnosis not present

## 2024-01-17 DIAGNOSIS — E291 Testicular hypofunction: Secondary | ICD-10-CM | POA: Diagnosis not present

## 2024-01-17 DIAGNOSIS — E782 Mixed hyperlipidemia: Secondary | ICD-10-CM | POA: Diagnosis not present

## 2024-01-17 DIAGNOSIS — R7303 Prediabetes: Secondary | ICD-10-CM | POA: Diagnosis not present

## 2024-01-17 DIAGNOSIS — E039 Hypothyroidism, unspecified: Secondary | ICD-10-CM | POA: Diagnosis not present

## 2024-01-23 DIAGNOSIS — H3323 Serous retinal detachment, bilateral: Secondary | ICD-10-CM | POA: Diagnosis not present

## 2024-01-23 DIAGNOSIS — I10 Essential (primary) hypertension: Secondary | ICD-10-CM | POA: Diagnosis not present

## 2024-01-23 DIAGNOSIS — F5221 Male erectile disorder: Secondary | ICD-10-CM | POA: Diagnosis not present

## 2024-01-23 DIAGNOSIS — I129 Hypertensive chronic kidney disease with stage 1 through stage 4 chronic kidney disease, or unspecified chronic kidney disease: Secondary | ICD-10-CM | POA: Diagnosis not present

## 2024-01-23 DIAGNOSIS — M545 Low back pain, unspecified: Secondary | ICD-10-CM | POA: Diagnosis not present

## 2024-01-23 DIAGNOSIS — M19071 Primary osteoarthritis, right ankle and foot: Secondary | ICD-10-CM | POA: Diagnosis not present

## 2024-01-23 DIAGNOSIS — N1831 Chronic kidney disease, stage 3a: Secondary | ICD-10-CM | POA: Diagnosis not present

## 2024-01-23 DIAGNOSIS — E039 Hypothyroidism, unspecified: Secondary | ICD-10-CM | POA: Diagnosis not present

## 2024-01-23 DIAGNOSIS — E782 Mixed hyperlipidemia: Secondary | ICD-10-CM | POA: Diagnosis not present

## 2024-01-23 DIAGNOSIS — R7303 Prediabetes: Secondary | ICD-10-CM | POA: Diagnosis not present

## 2024-01-23 DIAGNOSIS — G4733 Obstructive sleep apnea (adult) (pediatric): Secondary | ICD-10-CM | POA: Diagnosis not present

## 2024-01-23 DIAGNOSIS — E291 Testicular hypofunction: Secondary | ICD-10-CM | POA: Diagnosis not present

## 2024-01-23 DIAGNOSIS — Z4542 Encounter for adjustment and management of neuropacemaker (brain) (peripheral nerve) (spinal cord): Secondary | ICD-10-CM | POA: Diagnosis not present
